# Patient Record
Sex: Female | Born: 1995 | Hispanic: No | Marital: Single | Smoking: Former smoker
Health system: Southern US, Community
[De-identification: ages and names within clinical notes are randomized; demographics above are authoritative.]

## PROBLEM LIST (undated history)

## (undated) ENCOUNTER — Ambulatory Visit (HOSPITAL_BASED_OUTPATIENT_CLINIC_OR_DEPARTMENT_OTHER): Source: Home / Self Care

## (undated) DIAGNOSIS — F419 Anxiety disorder, unspecified: Secondary | ICD-10-CM

## (undated) DIAGNOSIS — S060XAA Concussion with loss of consciousness status unknown, initial encounter: Secondary | ICD-10-CM

## (undated) DIAGNOSIS — S060X9A Concussion with loss of consciousness of unspecified duration, initial encounter: Secondary | ICD-10-CM

## (undated) DIAGNOSIS — Z8489 Family history of other specified conditions: Secondary | ICD-10-CM

## (undated) DIAGNOSIS — Z8759 Personal history of other complications of pregnancy, childbirth and the puerperium: Secondary | ICD-10-CM

## (undated) DIAGNOSIS — O139 Gestational [pregnancy-induced] hypertension without significant proteinuria, unspecified trimester: Secondary | ICD-10-CM

## (undated) HISTORY — DX: Family history of other specified conditions: Z84.89

## (undated) HISTORY — DX: Gestational (pregnancy-induced) hypertension without significant proteinuria, unspecified trimester: O13.9

## (undated) HISTORY — PX: NO PAST SURGERIES: SHX2092

---
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
## 2014-10-06 ENCOUNTER — Emergency Department (HOSPITAL_COMMUNITY)
Admission: EM | Admit: 2014-10-06 | Discharge: 2014-10-06 | Disposition: A | Discharge status: Home / Self Care | Payer: BLUE CROSS/BLUE SHIELD | Attending: Emergency Medicine | Admitting: Emergency Medicine

## 2014-10-06 ENCOUNTER — Ambulatory Visit (INDEPENDENT_AMBULATORY_CARE_PROVIDER_SITE_OTHER): Payer: BLUE CROSS/BLUE SHIELD | Admitting: Emergency Medicine

## 2014-10-06 ENCOUNTER — Encounter (HOSPITAL_COMMUNITY): Payer: Self-pay | Admitting: Emergency Medicine

## 2014-10-06 VITALS — BP 100/60 | HR 81 | Temp 98.0°F | Resp 20 | Ht 61.25 in | Wt 112.1 lb

## 2014-10-06 DIAGNOSIS — S060X1A Concussion with loss of consciousness of 30 minutes or less, initial encounter: Secondary | ICD-10-CM

## 2014-10-06 DIAGNOSIS — Y9389 Activity, other specified: Secondary | ICD-10-CM | POA: Diagnosis not present

## 2014-10-06 DIAGNOSIS — S0001XA Abrasion of scalp, initial encounter: Secondary | ICD-10-CM | POA: Diagnosis not present

## 2014-10-06 DIAGNOSIS — Y998 Other external cause status: Secondary | ICD-10-CM | POA: Insufficient documentation

## 2014-10-06 DIAGNOSIS — R55 Syncope and collapse: Secondary | ICD-10-CM | POA: Diagnosis not present

## 2014-10-06 DIAGNOSIS — Z3202 Encounter for pregnancy test, result negative: Secondary | ICD-10-CM | POA: Insufficient documentation

## 2014-10-06 DIAGNOSIS — W0110XA Fall on same level from slipping, tripping and stumbling with subsequent striking against unspecified object, initial encounter: Secondary | ICD-10-CM | POA: Insufficient documentation

## 2014-10-06 DIAGNOSIS — Y9289 Other specified places as the place of occurrence of the external cause: Secondary | ICD-10-CM | POA: Insufficient documentation

## 2014-10-06 DIAGNOSIS — R5383 Other fatigue: Secondary | ICD-10-CM | POA: Diagnosis not present

## 2014-10-06 LAB — COMPREHENSIVE METABOLIC PANEL
ALK PHOS: 69 U/L (ref 38–126)
ALT: 24 U/L (ref 14–54)
AST: 25 U/L (ref 15–41)
Albumin: 4.4 g/dL (ref 3.5–5.0)
Anion gap: 9 (ref 5–15)
BUN: 12 mg/dL (ref 6–20)
CHLORIDE: 106 mmol/L (ref 101–111)
CO2: 23 mmol/L (ref 22–32)
Calcium: 9.6 mg/dL (ref 8.9–10.3)
Creatinine, Ser: 0.54 mg/dL (ref 0.44–1.00)
GFR calc Af Amer: >60 mL/min (ref >60)
GLUCOSE: 90 mg/dL (ref 70–99)
POTASSIUM: 3.8 mmol/L (ref 3.5–5.1)
SODIUM: 138 mmol/L (ref 135–145)
TOTAL PROTEIN: 7.5 g/dL (ref 6.5–8.1)
Total Bilirubin: 0.6 mg/dL (ref 0.3–1.2)

## 2014-10-06 LAB — CBC WITH DIFFERENTIAL/PLATELET
Basophils Absolute: 0 10*3/uL (ref 0.0–0.1)
Basophils Relative: 0 % (ref 0–1)
Eosinophils Absolute: 0.1 10*3/uL (ref 0.0–0.7)
Eosinophils Relative: 1 % (ref 0–5)
HCT: 39.3 % (ref 36.0–46.0)
Hemoglobin: 12.9 g/dL (ref 12.0–15.0)
LYMPHS PCT: 18 % (ref 12–46)
Lymphs Abs: 1.7 10*3/uL (ref 0.7–4.0)
MCH: 29 pg (ref 26.0–34.0)
MCHC: 32.8 g/dL (ref 30.0–36.0)
MCV: 88.3 fL (ref 78.0–100.0)
MONO ABS: 0.4 10*3/uL (ref 0.1–1.0)
Monocytes Relative: 4 % (ref 3–12)
NEUTROS ABS: 7.4 10*3/uL (ref 1.7–7.7)
Neutrophils Relative %: 77 % (ref 43–77)
PLATELETS: 295 10*3/uL (ref 150–400)
RBC: 4.45 MIL/uL (ref 3.87–5.11)
RDW: 12.4 % (ref 11.5–15.5)
WBC: 9.6 10*3/uL (ref 4.0–10.5)

## 2014-10-06 LAB — POC URINE PREG, ED: PREG TEST UR: NEGATIVE

## 2014-10-06 LAB — URINE MICROSCOPIC-ADD ON

## 2014-10-06 LAB — URINALYSIS, ROUTINE W REFLEX MICROSCOPIC
Bilirubin Urine: NEGATIVE
GLUCOSE, UA: NEGATIVE mg/dL
Hgb urine dipstick: NEGATIVE
KETONES UR: NEGATIVE mg/dL
NITRITE: NEGATIVE
PH: 6.5 (ref 5.0–8.0)
PROTEIN: NEGATIVE mg/dL
SPECIFIC GRAVITY, URINE: 1.02 (ref 1.005–1.030)
UROBILINOGEN UA: 1 mg/dL (ref 0.0–1.0)

## 2014-10-06 MED ORDER — ACETAMINOPHEN 500 MG PO TABS
1000.0000 mg | ORAL_TABLET | Freq: Once | ORAL | Status: AC
  Administered 2014-10-06: 1000 mg via ORAL
  Filled 2014-10-06: qty 2

## 2014-10-06 NOTE — ED Provider Notes (Signed)
CSN: [id number]     Arrival date & time 10/06/14  1910 History  This chart was scribed for non-physician practitioner [name], PA-C working with [name], MD by [name], ED Scribe. This patient was seen in room TR09C/TR09C and the patient's care was started at 11:03 PM.  Chief Complaint  Patient presents with  . Loss of Consciousness     Patient is a 19 y.o. female presenting with syncope. The history is provided by the patient and a parent. No language interpreter was used.  Loss of Consciousness Associated symptoms: headaches   Associated symptoms: no chest pain, no confusion, no fever, no nausea, no shortness of breath, no vomiting and no weakness      HPI Comments: Alexis Hunt is a 19 y.o. female who presents to the Emergency Department complaining of LOC that occurred once earlier today around 8 hours PTA. Pt states that she was at her house earlier, and got up quickly to walk around when she became dizzy. Pt states she then sat back down to rest for a few minutes, got up again, and then had a syncopal episode. Pt states that she fell and hit the left side of her head on the concrete when this occurred. Pt states she was told that she was unconscious for a few minutes. She remembers events before and after the fall. Pt also reports having blurred vision when she woke up but this has resolved.. Her mother reports arriving one hour later and states she was drowsy, but responsive and appropriate. 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 These are usually caused by pain, nausea, seeing blood, etc. Patient actually had a syncopal episode while in the emergency department while her blood was being drawn and quickly returned to baseline. She vomited one time after this. Otherwise, patient has not vomited. Patient denies signs of stroke including: facial droop, slurred  speech, aphasia, weakness/numbness in extremities, imbalance/trouble walking. No bleeding.   Patient was seen at an outside urgent care prior to this ED visit and was referred to the emergency department.    History reviewed. No pertinent past medical history. History reviewed. No pertinent past surgical history. Family History  Problem Relation Age of Onset  . Heart disease Maternal Grandfather   . Hyperlipidemia Maternal Grandfather   . Stroke Maternal Grandfather   . Hyperlipidemia Paternal Grandmother   . Hyperlipidemia Paternal Grandfather    History  Substance Use Topics  . Smoking status: Never Smoker   . Smokeless tobacco: Never Used  . Alcohol Use: No   OB History    No data available     Review of Systems  Constitutional: Positive for fatigue. Negative for fever.  HENT: Negative for tinnitus.   Eyes: Positive for visual disturbance. Negative for photophobia and pain.  Respiratory: Negative for cough and shortness of breath.   Cardiovascular: Positive for syncope. Negative for chest pain and leg swelling.  Gastrointestinal: Negative for nausea and vomiting.  Musculoskeletal: Negative for back pain, gait problem and neck pain.  Skin: Negative for wound.  Neurological: Positive for syncope, light-headedness and headaches. Negative for speech difficulty, weakness and numbness.  Psychiatric/Behavioral: Negative for confusion and decreased concentration.      Allergies  Review of patient's allergies indicates no known allergies.  Home Medications   Prior to Admission medications   Medication Sig Start Date End Date Taking?  Authorizing Provider  norethindrone-ethinyl estradiol (MICROGESTIN,JUNEL,LOESTRIN) 1-20 MG-MCG tablet Take by mouth. 06/24/14   Historical Provider, MD   BP 121/77 mmHg  Pulse 71  Temp(Src) 98.5 F (36.9 C) (Oral)  Resp 16  SpO2 99%  LMP 09/26/2014   Physical Exam  Constitutional: She is oriented to person, place, and time. She appears  well-developed and well-nourished.  HENT:  Head: Normocephalic and atraumatic. Head is without raccoon's eyes and without Battle's sign.  Right Ear: Tympanic membrane, external ear and ear canal normal. No hemotympanum.  Left Ear: Tympanic membrane, external ear and ear canal normal. No hemotympanum.  Nose: Nose normal. No nasal septal hematoma.  Mouth/Throat: Uvula is midline, oropharynx is clear and moist and mucous membranes are normal.  Mild abrasion left parietal scalp. Mild overlying swelling. No depressions.  Eyes: Conjunctivae, EOM and lids are normal. Pupils are equal, round, and reactive to light. Right eye exhibits no nystagmus. Left eye exhibits no nystagmus.  No visible hyphema noted  Neck: Normal range of motion. Neck supple.  Cardiovascular: Normal rate and regular rhythm.   No murmur heard. Pulmonary/Chest: Effort normal and breath sounds normal. No respiratory distress. She has no wheezes. She has no rales.  Abdominal: Soft. There is no tenderness.  Musculoskeletal: She exhibits no edema or tenderness.       Cervical back: She exhibits normal range of motion, no tenderness and no bony tenderness.       Thoracic back: She exhibits no tenderness and no bony tenderness.       Lumbar back: She exhibits no tenderness and no bony tenderness.  Neurological: She is alert and oriented to person, place, and time. She has normal strength and normal reflexes. No cranial nerve deficit or sensory deficit. Coordination normal. GCS eye subscore is 4. GCS verbal subscore is 5. GCS motor subscore is 6.  Skin: Skin is warm and dry.  Psychiatric: She has a normal mood and affect.  Nursing note and vitals reviewed.   ED Course  Procedures (including critical care time)  DIAGNOSTIC STUDIES: Oxygen Saturation is 99% on room air, normal by my interpretation.    COORDINATION OF CARE: 11:13 PM Discussed treatment plan with pt at bedside and pt agreed to plan.   Labs Review Labs Reviewed   URINALYSIS, ROUTINE W REFLEX MICROSCOPIC - Abnormal; Notable for the following:    APPearance TURBID (*)    Leukocytes, UA SMALL (*)    All other components within normal limits  URINE MICROSCOPIC-ADD ON - Abnormal; Notable for the following:    Squamous Epithelial / LPF FEW (*)    Bacteria, UA MANY (*)    Crystals CA OXALATE CRYSTALS (*)    All other components within normal limits  CBC WITH DIFFERENTIAL/PLATELET  COMPREHENSIVE METABOLIC PANEL  POC URINE PREG, ED    Imaging Review No results found.   EKG Interpretation   Date/Time:  Monday Oct 06 2014 22:55:20 EDT Ventricular Rate:  61 PR Interval:  144 QRS Duration: 76 QT Interval:  410 QTC Calculation: 412 R Axis:   83 Text Interpretation:  Normal sinus rhythm with sinus arrhythmia  Nonspecific ST abnormality Abnormal ECG Confirmed by Bebe Shaggy  MD, DONALD  [phone]) on 10/06/2014 11:32:13 PM      Vital signs reviewed and are as follows: Filed Vitals:   10/06/14 2347  BP: 101/50  Pulse: 58  Temp: 98.1 F (36.7 C)  Resp: 18   Patient is at her baseline. She is now greater than 8 hours after  the incident and is feeling well other than having a headache. Patient and family are comfortable with discharge to home. We reviewed all results including EKG, blood and urine tests. We discussed signs and symptoms of concussion and when to follow-up with her PCP. Patient was counseled on head injury precautions and symptoms that should indicate their return to the ED. These include severe worsening headache, vision changes, confusion, loss of consciousness, trouble walking, nausea & vomiting, or weakness/tingling in extremities.       MDM   Final diagnoses:  Syncope, unspecified syncope type  Concussion, with loss of consciousness of 30 minutes or less, initial encounter   Patient with syncope with prodrome. It was similar to previous episodes of syncope. Unclear exacerbating factor today. Main concern at this time was head  injury. Patient is been monitored for several hours in emergency department. She has no indication for head CT based on [place] head CT rules. Patient has no neurological deficits at all on exam. She is at her baseline. She has family who will watch her tonight. Patient does have some fatigue which may be a sign of concussion. Otherwise she appears well, is ambulating normally. No vomiting.  No concern for PE causing syncopal episode today. Very low suspicion for cardiac etiology. No signs of WPW, QTc prolongation, or Brugada syndrome on EKG. No family history of early sudden cardiac death. Symptoms did not occur with exercise.  I personally performed the services described in this documentation, which was scribed in my presence. The recorded information has been reviewed and is accurate.    Renne CriglerJoshua Omarri Eich, PA-C 10/07/14 0013  Zadie Rhineonald Wickline, MD 10/08/14 [phone]

## 2014-10-06 NOTE — Progress Notes (Signed)
[facility] [address], [place] [id number] [phone]  Date:  10/06/2014   Name:  Alexis LipsBrianna Hunt   DOB:  1996-04-05   MRN:  [id number]  PCP:  No PCP Per Patient    Chief Complaint: Loss of Consciousness   History of Present Illness:  Alexis Hunt is a 19 y.o. very pleasant female patient who presents with the following:  Patient stood up and felt dizzy and tried to go into the house and passed out She fell and struck her left parietal area Was unconscious for 3 minutes. Spontaneously recovered Had no seizure. Now has severe left pounding headache No neuro  Symptoms.  Has blurred vision. Has history of recurrent syncopal episodes in past undiagnosed No improvement with over the counter medications or other home remedies.  Denies other complaint or health concern today.   There are no active problems to display for this patient.   History reviewed. No pertinent past medical history.  History reviewed. No pertinent past surgical history.  History  Substance Use Topics  . Smoking status: Never Smoker   . Smokeless tobacco: Never Used  . Alcohol Use: No    Family History  Problem Relation Age of Onset  . Heart disease Maternal Grandfather   . Hyperlipidemia Maternal Grandfather   . Stroke Maternal Grandfather   . Hyperlipidemia Paternal Grandmother   . Hyperlipidemia Paternal Grandfather     No Known Allergies  Medication list has been reviewed and updated.  No current outpatient prescriptions on file prior to visit.   No current facility-administered medications on file prior to visit.    Review of Systems:  Review of Systems  Constitutional: Negative for fever, chills and fatigue.  HENT: Negative for congestion, ear pain, hearing loss, postnasal drip, rhinorrhea and sinus pressure.   Eyes: Negative for discharge and redness.  Respiratory: Negative for cough, shortness of breath and wheezing.   Cardiovascular: Negative for  chest pain and leg swelling.  Gastrointestinal: Negative for nausea, vomiting, abdominal pain, constipation and blood in stool.  Genitourinary: Negative for dysuria, urgency and frequency.  Musculoskeletal: Negative for neck stiffness.  Skin: Negative for rash.  Neurological: Negative for seizures, weakness and headaches.     Physical Examination: Filed Vitals:   10/06/14 1819  BP: 100/60  Pulse: 81  Temp: 98 F (36.7 C)  Resp: 20   Filed Vitals:   10/06/14 1819  Height: 5' 1.25" (1.556 m)  Weight: 112 lb 2 oz (50.86 kg)   Body mass index is 21.01 kg/(m^2). Ideal Body Weight: Weight in (lb) to have BMI = 25: 133.1  GEN: WDWN, NAD, Non-toxic, A & O x 3 HEENT: cephalhematoma left, Normocephalic. Neck supple. No masses, No LAD. Ears and Nose: No external deformity. CV: RRR, No M/G/R. No JVD. No thrill. No extra heart sounds. PULM: CTA B, no wheezes, crackles, rhonchi. No retractions. No resp. distress. No accessory muscle use. ABD: S, NT, ND, +BS. No rebound. No HSM. EXTR: No c/c/e NEURO Normal gait. PRRERLA EOMI CN 2-12 intact.  Romberg and tandem gait intact PSYCH: Normally interactive. Conversant. Not depressed or anxious appearing.  Calm demeanor.    Assessment and Plan: Syncope unknown etiology [facility] To ER for evaluation   Signed Phillips OdorJeffery Anderson, MD

## 2014-10-06 NOTE — Patient Instructions (Signed)
Concussion  A concussion, or closed-head injury, is a brain injury caused by a direct blow to the head or by a quick and sudden movement (jolt) of the head or neck. Concussions are usually not life-threatening. Even so, the effects of a concussion can be serious. If you have had a concussion before, you are more likely to experience concussion-like symptoms after a direct blow to the head.   CAUSES  · Direct blow to the head, such as from running into another player during a soccer game, being hit in a fight, or hitting your head on a hard surface.  · A jolt of the head or neck that causes the brain to move back and forth inside the skull, such as in a car crash.  SIGNS AND SYMPTOMS  The signs of a concussion can be hard to notice. Early on, they may be missed by you, family members, and health care providers. You may look fine but act or feel differently.  Symptoms are usually temporary, but they may last for days, weeks, or even longer. Some symptoms may appear right away while others may not show up for hours or days. Every head injury is different. Symptoms include:  · Mild to moderate headaches that will not go away.  · A feeling of pressure inside your head.  · Having more trouble than usual:  ¨ Learning or remembering things you have heard.  ¨ Answering questions.  ¨ Paying attention or concentrating.  ¨ Organizing daily tasks.  ¨ Making decisions and solving problems.  · Slowness in thinking, acting or reacting, speaking, or reading.  · Getting lost or being easily confused.  · Feeling tired all the time or lacking energy (fatigued).  · Feeling drowsy.  · Sleep disturbances.  ¨ Sleeping more than usual.  ¨ Sleeping less than usual.  ¨ Trouble falling asleep.  ¨ Trouble sleeping (insomnia).  · Loss of balance or feeling lightheaded or dizzy.  · Nausea or vomiting.  · Numbness or tingling.  · Increased sensitivity to:  ¨ Sounds.  ¨ Lights.  ¨ Distractions.  · Vision problems or eyes that tire  easily.  · Diminished sense of taste or smell.  · Ringing in the ears.  · Mood changes such as feeling sad or anxious.  · Becoming easily irritated or angry for little or no reason.  · Lack of motivation.  · Seeing or hearing things other people do not see or hear (hallucinations).  DIAGNOSIS  Your health care provider can usually diagnose a concussion based on a description of your injury and symptoms. He or she will ask whether you passed out (lost consciousness) and whether you are having trouble remembering events that happened right before and during your injury.  Your evaluation might include:  · A brain scan to look for signs of injury to the brain. Even if the test shows no injury, you may still have a concussion.  · Blood tests to be sure other problems are not present.  TREATMENT  · Concussions are usually treated in an emergency department, in urgent care, or at a clinic. You may need to stay in the hospital overnight for further treatment.  · Tell your health care provider if you are taking any medicines, including prescription medicines, over-the-counter medicines, and natural remedies. Some medicines, such as blood thinners (anticoagulants) and aspirin, may increase the chance of complications. Also tell your health care provider whether you have had alcohol or are taking illegal drugs. This information   may affect treatment.  · Your health care provider will send you home with important instructions to follow.  · How fast you will recover from a concussion depends on many factors. These factors include how severe your concussion is, what part of your brain was injured, your age, and how healthy you were before the concussion.  · Most people with mild injuries recover fully. Recovery can take time. In general, recovery is slower in older persons. Also, persons who have had a concussion in the past or have other medical problems may find that it takes longer to recover from their current injury.  HOME  CARE INSTRUCTIONS  General Instructions  · Carefully follow the directions your health care provider gave you.  · Only take over-the-counter or prescription medicines for pain, discomfort, or fever as directed by your health care provider.  · Take only those medicines that your health care provider has approved.  · Do not drink alcohol until your health care provider says you are well enough to do so. Alcohol and certain other drugs may slow your recovery and can put you at risk of further injury.  · If it is harder than usual to remember things, write them down.  · If you are easily distracted, try to do one thing at a time. For example, do not try to watch TV while fixing dinner.  · Talk with family members or close friends when making important decisions.  · Keep all follow-up appointments. Repeated evaluation of your symptoms is recommended for your recovery.  · Watch your symptoms and tell others to do the same. Complications sometimes occur after a concussion. Older adults with a brain injury may have a higher risk of serious complications, such as a blood clot on the brain.  · Tell your teachers, school nurse, school counselor, coach, athletic trainer, or work manager about your injury, symptoms, and restrictions. Tell them about what you can or cannot do. They should watch for:  ¨ Increased problems with attention or concentration.  ¨ Increased difficulty remembering or learning new information.  ¨ Increased time needed to complete tasks or assignments.  ¨ Increased irritability or decreased ability to cope with stress.  ¨ Increased symptoms.  · Rest. Rest helps the brain to heal. Make sure you:  ¨ Get plenty of sleep at night. Avoid staying up late at night.  ¨ Keep the same bedtime hours on weekends and weekdays.  ¨ Rest during the day. Take daytime naps or rest breaks when you feel tired.  · Limit activities that require a lot of thought or concentration. These include:  ¨ Doing homework or job-related  work.  ¨ Watching TV.  ¨ Working on the computer.  · Avoid any situation where there is potential for another head injury (football, hockey, soccer, basketball, martial arts, downhill snow sports and horseback riding). Your condition will get worse every time you experience a concussion. You should avoid these activities until you are evaluated by the appropriate follow-up health care providers.  Returning To Your Regular Activities  You will need to return to your normal activities slowly, not all at once. You must give your body and brain enough time for recovery.  · Do not return to sports or other athletic activities until your health care provider tells you it is safe to do so.  · Ask your health care provider when you can drive, ride a bicycle, or operate heavy machinery. Your ability to react may be slower after a   brain injury. Never do these activities if you are dizzy.  · Ask your health care provider about when you can return to work or school.  Preventing Another Concussion  It is very important to avoid another brain injury, especially before you have recovered. In rare cases, another injury can lead to permanent brain damage, brain swelling, or death. The risk of this is greatest during the first 7-10 days after a head injury. Avoid injuries by:  · Wearing a seat belt when riding in a car.  · Drinking alcohol only in moderation.  · Wearing a helmet when biking, skiing, skateboarding, skating, or doing similar activities.  · Avoiding activities that could lead to a second concussion, such as contact or recreational sports, until your health care provider says it is okay.  · Taking safety measures in your home.  ¨ Remove clutter and tripping hazards from floors and stairways.  ¨ Use grab bars in bathrooms and handrails by stairs.  ¨ Place non-slip mats on floors and in bathtubs.  ¨ Improve lighting in dim areas.  SEEK MEDICAL CARE IF:  · You have increased problems paying attention or  concentrating.  · You have increased difficulty remembering or learning new information.  · You need more time to complete tasks or assignments than before.  · You have increased irritability or decreased ability to cope with stress.  · You have more symptoms than before.  Seek medical care if you have any of the following symptoms for more than 2 weeks after your injury:  · Lasting (chronic) headaches.  · Dizziness or balance problems.  · Nausea.  · Vision problems.  · Increased sensitivity to noise or light.  · Depression or mood swings.  · Anxiety or irritability.  · Memory problems.  · Difficulty concentrating or paying attention.  · Sleep problems.  · Feeling tired all the time.  SEEK IMMEDIATE MEDICAL CARE IF:  · You have severe or worsening headaches. These may be a sign of a blood clot in the brain.  · You have weakness (even if only in one hand, leg, or part of the face).  · You have numbness.  · You have decreased coordination.  · You vomit repeatedly.  · You have increased sleepiness.  · One pupil is larger than the other.  · You have convulsions.  · You have slurred speech.  · You have increased confusion. This may be a sign of a blood clot in the brain.  · You have increased restlessness, agitation, or irritability.  · You are unable to recognize people or places.  · You have neck pain.  · It is difficult to wake you up.  · You have unusual behavior changes.  · You lose consciousness.  MAKE SURE YOU:  · Understand these instructions.  · Will watch your condition.  · Will get help right away if you are not doing well or get worse.  Document Released: 08/13/2003 Document Revised: 05/28/2013 Document Reviewed: 12/13/2012  Exit[employer]® Patient Information ©2015 [employer]. This information is not intended to replace advice given to you by your health care provider. Make sure you discuss any questions you have with your health care provider.

## 2014-10-06 NOTE — ED Notes (Signed)
Pt. reports syncopal episode " passed out "  this afternoon , hit her head against pavement , reports pain at left side of head . Alert and oriented , speech clear / no facial asymmetry , equal grips with no arm drift, ambulatory.

## 2014-10-06 NOTE — ED Notes (Signed)
Josh, pa-c, at the bedside.  

## 2014-10-06 NOTE — Discharge Instructions (Signed)
Please read and follow all provided instructions.  Your diagnoses today include:  1. Syncope, unspecified syncope type   2. Concussion, with loss of consciousness of 30 minutes or less, initial encounter     Tests performed today include:  Blood counts and electrolytes - normal  Urine test - no infection or dehydration, calcium oxalate crystals noted  Pregnancy test - negative  EKG - normal  Vital signs. See below for your results today.   Medications prescribed:   None  Take any prescribed medications only as directed.  Home care instructions:  Follow any educational materials contained in this packet.  Do not take any medications containing aspirin for one week as this can interfere with your body's ability to clot.   Follow-up instructions: Please follow-up with your primary care provider in the next 3 days for further evaluation of your symptoms.   Return instructions:  SEEK IMMEDIATE MEDICAL ATTENTION IF:  There is confusion or drowsiness (although children frequently become drowsy after injury).   You cannot awaken the injured person.   You have more than one episode of vomiting.   You notice dizziness or unsteadiness which is getting worse, or inability to walk.   You have convulsions or unconsciousness.   You experience severe, persistent headaches not relieved by Tylenol.  You cannot use arms or legs normally.   There are changes in pupil sizes. (This is the black center in the colored part of the eye)   There is clear or bloody discharge from the nose or ears.   You have change in speech, vision, swallowing, or understanding.   Localized weakness, numbness, tingling, or change in bowel or bladder control.  You have any other emergent concerns.  Additional Information: You have had a head injury which does not appear to require admission at this time.  Your vital signs today were: BP 121/77 mmHg   Pulse 71   Temp(Src) 98.5 F (36.9 C) (Oral)    Resp 16   SpO2 99%   LMP 09/26/2014 If your blood pressure (BP) was elevated above 135/85 this visit, please have this repeated by your doctor within one month. --------------

## 2014-10-09 ENCOUNTER — Emergency Department (HOSPITAL_COMMUNITY)
Admission: EM | Admit: 2014-10-09 | Discharge: 2014-10-09 | Disposition: A | Discharge status: Home / Self Care | Payer: BLUE CROSS/BLUE SHIELD | Attending: Emergency Medicine | Admitting: Emergency Medicine

## 2014-10-09 ENCOUNTER — Encounter (HOSPITAL_COMMUNITY): Payer: Self-pay | Admitting: Emergency Medicine

## 2014-10-09 DIAGNOSIS — G44309 Post-traumatic headache, unspecified, not intractable: Secondary | ICD-10-CM | POA: Insufficient documentation

## 2014-10-09 DIAGNOSIS — F0781 Postconcussional syndrome: Secondary | ICD-10-CM | POA: Insufficient documentation

## 2014-10-09 DIAGNOSIS — R079 Chest pain, unspecified: Secondary | ICD-10-CM | POA: Insufficient documentation

## 2014-10-09 HISTORY — DX: Concussion with loss of consciousness of unspecified duration, initial encounter: S06.0X9A

## 2014-10-09 HISTORY — DX: Concussion with loss of consciousness status unknown, initial encounter: S06.0XAA

## 2014-10-09 NOTE — ED Notes (Signed)
Pt was seen and treated for a concussion on 5/2 after a fall.  Pt st's she has continued to have headache since then.  Denies nausea or vomiting.  Pt alert and oriented x's 3

## 2014-10-09 NOTE — ED Provider Notes (Signed)
CSN: [id number]     Arrival date & time 10/09/14  1551 History  This chart was scribed for Fayrene HelperBowie Brode Sculley, PA-C, working with Blake DivineJohn Wofford, MD by Elon SpannerGarrett Cook, ED Scribe. This patient was seen in room TR02C/TR02C and the patient's care was started at 4:45 PM.   Chief Complaint  Patient presents with  . Headache   The history is provided by the patient. No language interpreter was used.   HPI Comments: Alexis Hunt is a 19 y.o. female with a history of undiagnosed syncopal episodes who presents to the Emergency Department complaining of a pounding headache onset 4 days ago after the patient had a syncopal episode.  The headache has been constant since this time and is rated currently 7/10.  She was seen in the ED the day of onset and where she had labs performed but no imaging.  She was diagnosed with a concussion and advised of return precautions.  She reports yesterday she had some impaired balance and minor, transient confusion at times during the day.  She has also had some pleuritic CP the past two mornings with inspiration.  She also notes that a primary reason for her coming to the ED today is to receive a work note to provide her employer stating she can return to work.  Patient denies history of CP or DVT/PE.  Patient denies recent surgeries.   Patient denies neck pain, leg swelling, numbness/weakness.     Past Medical History  Diagnosis Date  . Concussion    History reviewed. No pertinent past surgical history. Family History  Problem Relation Age of Onset  . Heart disease Maternal Grandfather   . Hyperlipidemia Maternal Grandfather   . Stroke Maternal Grandfather   . Hyperlipidemia Paternal Grandmother   . Hyperlipidemia Paternal Grandfather    History  Substance Use Topics  . Smoking status: Never Smoker   . Smokeless tobacco: Never Used  . Alcohol Use: No   OB History    No data available     Review of Systems  Cardiovascular: Positive for chest pain. Negative for leg  swelling.  Musculoskeletal: Negative for neck pain.  Neurological: Positive for headaches. Negative for weakness and numbness.  All other systems reviewed and are negative.     Allergies  Review of patient's allergies indicates no known allergies.  Home Medications   Prior to Admission medications   Medication Sig Start Date End Date Taking? Authorizing Provider  norethindrone-ethinyl estradiol (MICROGESTIN,JUNEL,LOESTRIN) 1-20 MG-MCG tablet Take by mouth. 06/24/14   Historical Provider, MD   BP 134/72 mmHg  Pulse 89  Temp(Src) 99.1 F (37.3 C) (Oral)  Resp 20  SpO2 99%  LMP 09/26/2014 Physical Exam  Constitutional: She is oriented to person, place, and time. She appears well-developed and well-nourished. No distress.  HENT:  Head: Normocephalic and atraumatic.  Eyes: Conjunctivae and EOM are normal.  Neck: Neck supple. No tracheal deviation present.  Cardiovascular: Normal rate.  Exam reveals no gallop and no friction rub.   No murmur heard. Pulmonary/Chest: Effort normal. No respiratory distress.  Musculoskeletal: Normal range of motion.  Neurological: She is alert and oriented to person, place, and time.  Neurologic exam:  Speech clear, pupils equal round reactive to light, extraocular movements intact  Normal peripheral visual fields Cranial nerves III through XII normal including no facial droop Follows commands, moves all extremities x4, normal strength to bilateral upper and lower extremities at all major muscle groups including grip Sensation normal to light touch and pinprick Coordination  intact, no limb ataxia, finger-nose-finger normal Rapid alternating movements normal No pronator drift Gait normal   Skin: Skin is warm and dry.  Psychiatric: She has a normal mood and affect. Her behavior is normal.  Nursing note and vitals reviewed.   ED Course  Procedures (including critical care time)  DIAGNOSTIC STUDIES: Oxygen Saturation is 99% on RA, normal by  my interpretation.    COORDINATION OF CARE:  4:56 PM Discussed treatment plan with patient at bedside.  Patient acknowledges and agrees with plan.    5:06 PM Patient presents with lasting headache and symptoms consistence with postconcussive syndrome. She has no focal neuro deficit on exam. She has had a prior EKG shows normal sinus rhythm with sinus arrhythmia. I recommend patient to follow-up with cardiologist for outpatient evaluation. She did report some chest discomfort. However have low suspicion that this is related to PE, ACS or other acute emergent medical condition. Her primary request this to have a note to return to work. Work note provided. Return precautions discussed. I do not think advanced imaging is beneficial at this time and patient agrees.  Labs Review Labs Reviewed - No data to display  Imaging Review No results found.   EKG Interpretation None      MDM   Final diagnoses:  Post concussive syndrome    BP 134/72 mmHg  Pulse 89  Temp(Src) 99.1 F (37.3 C) (Oral)  Resp 20  SpO2 99%  LMP 09/26/2014   I personally performed the services described in this documentation, which was scribed in my presence. The recorded information has been reviewed and is accurate.     Fayrene HelperBowie Zyron Deeley, PA-C 10/09/14 1708  Blake DivineJohn Wofford, MD 10/11/14 [phone]

## 2014-10-09 NOTE — Discharge Instructions (Signed)
Post-Concussion Syndrome Post-concussion syndrome describes the symptoms that can occur after a head injury. These symptoms can last from weeks to months. CAUSES  It is not clear why some head injuries cause post-concussion syndrome. It can occur whether your head injury was mild or severe and whether you were wearing head protection or not.  SIGNS AND SYMPTOMS  Memory difficulties.  Dizziness.  Headaches.  Double vision or blurry vision.  Sensitivity to light.  Hearing difficulties.  Depression.  Tiredness.  Weakness.  Difficulty with concentration.  Difficulty sleeping or staying asleep.  Vomiting.  Poor balance or instability on your feet.  Slow reaction time.  Difficulty learning and remembering things you have heard. DIAGNOSIS  There is no test to determine whether you have post-concussion syndrome. Your health care provider may order an imaging scan of your brain, such as a CT scan, to check for other problems that may be causing your symptoms (such as severe injury inside your skull). TREATMENT  Usually, these problems disappear over time without medical care. Your health care provider may prescribe medicine to help ease your symptoms. It is important to follow up with a neurologist to evaluate your recovery and address any lingering symptoms or issues. HOME CARE INSTRUCTIONS   Only take over-the-counter or prescription medicines for pain, discomfort, or fever as directed by your health care provider. Do not take aspirin. Aspirin can slow blood clotting.  Sleep with your head slightly elevated to help with headaches.  Avoid any situation where there is potential for another head injury (football, hockey, soccer, basketball, martial arts, downhill snow sports, and horseback riding). Your condition will get worse every time you experience a concussion. You should avoid these activities until you are evaluated by the appropriate follow-up health care  providers.  Keep all follow-up appointments as directed by your health care provider. SEEK IMMEDIATE MEDICAL CARE IF:  You develop confusion or unusual drowsiness.  You cannot wake the injured person.  You develop nausea or persistent, forceful vomiting.  You feel like you are moving when you are not (vertigo).  You notice the injured person's eyes moving rapidly back and forth. This may be a sign of vertigo.  You have convulsions or faint.  You have severe, persistent headaches that are not relieved by medicine.  You cannot use your arms or legs normally.  Your pupils change size.  You have clear or bloody discharge from the nose or ears.  Your problems are getting worse, not better. MAKE SURE YOU:  Understand these instructions.  Will watch your condition.  Will get help right away if you are not doing well or get worse. Document Released: 11/12/2001 Document Revised: 03/13/2013 Document Reviewed: 08/28/2013 [employer] Patient Information 2015 [employer]. This information is not intended to replace advice given to you by your health care provider. Make sure you discuss any questions you have with your health care provider.  

## 2014-10-09 NOTE — ED Notes (Signed)
Pt A&Ox4, ambulatory at d/c with steady gait, NAD 

## 2015-02-01 ENCOUNTER — Encounter (HOSPITAL_COMMUNITY): Payer: Self-pay | Admitting: Emergency Medicine

## 2015-02-01 ENCOUNTER — Emergency Department (HOSPITAL_COMMUNITY)
Admission: EM | Admit: 2015-02-01 | Discharge: 2015-02-01 | Discharge status: Against Medical Advice | Payer: BLUE CROSS/BLUE SHIELD | Attending: Emergency Medicine | Admitting: Emergency Medicine

## 2015-02-01 ENCOUNTER — Emergency Department (HOSPITAL_COMMUNITY): Payer: BLUE CROSS/BLUE SHIELD

## 2015-02-01 DIAGNOSIS — Z79818 Long term (current) use of other agents affecting estrogen receptors and estrogen levels: Secondary | ICD-10-CM | POA: Diagnosis not present

## 2015-02-01 DIAGNOSIS — R109 Unspecified abdominal pain: Secondary | ICD-10-CM | POA: Diagnosis present

## 2015-02-01 DIAGNOSIS — R079 Chest pain, unspecified: Secondary | ICD-10-CM | POA: Diagnosis not present

## 2015-02-01 DIAGNOSIS — Z87828 Personal history of other (healed) physical injury and trauma: Secondary | ICD-10-CM | POA: Diagnosis not present

## 2015-02-01 NOTE — Discharge Instructions (Signed)
I am concerned that you may have a pulmonary embolus which is a blood clot in your lung causing your pain.  It is recommended that you have blood work done to evaluate this further.  You have refused blood work and further workup.  If you change your mind, you're welcome to come back at any time.  I recommend that you stop taking birth control pills.  It is also recommended that you quit smoking.  Tylenol or Ibuprofen for pain.   Pain of Unknown Etiology (Pain Without a Known Cause) You have come to your caregiver because of pain. Pain can occur in any part of the body. Often there is not a definite cause. If your laboratory (blood or urine) work was normal and X-rays or other studies were normal, your caregiver may treat you without knowing the cause of the pain. An example of this is the headache. Most headaches are diagnosed by taking a history. This means your caregiver asks you questions about your headaches. Your caregiver determines a treatment based on your answers. Usually testing done for headaches is normal. Often testing is not done unless there is no response to medications. Regardless of where your pain is located today, you can be given medications to make you comfortable. If no physical cause of pain can be found, most cases of pain will gradually leave as suddenly as they came.  If you have a painful condition and no reason can be found for the pain, it is important that you follow up with your caregiver. If the pain becomes worse or does not go away, it may be necessary to repeat tests and look further for a possible cause.  Only take over-the-counter or prescription medicines for pain, discomfort, or fever as directed by your caregiver.  For the protection of your privacy, test results cannot be given over the phone. Make sure you receive the results of your test. Ask how these results are to be obtained if you have not been informed. It is your responsibility to obtain your test  results.  You may continue all activities unless the activities cause more pain. When the pain lessens, it is important to gradually resume normal activities. Resume activities by beginning slowly and gradually increasing the intensity and duration of the activities or exercise. During periods of severe pain, bed rest may be helpful. Lie or sit in any position that is comfortable.  Ice used for acute (sudden) conditions may be effective. Use a large plastic bag filled with ice and wrapped in a towel. This may provide pain relief.  See your caregiver for continued problems. Your caregiver can help or refer you for exercises or physical therapy if necessary. If you were given medications for your condition, do not drive, operate machinery or power tools, or sign legal documents for 24 hours. Do not drink alcohol, take sleeping pills, or take other medications that may interfere with treatment. See your caregiver immediately if you have pain that is becoming worse and not relieved by medications. Document Released: 02/15/2001 Document Revised: 03/13/2013 Document Reviewed: 05/23/2005 [facility] Patient Information 2015 [place], [place]. This information is not intended to replace advice given to you by your health care provider. Make sure you discuss any questions you have with your health care provider.

## 2015-02-01 NOTE — ED Notes (Signed)
Pt arrived to the Ed with a complaint of right sided rib pain.  Pt states pain began late Thursday evening.  Pt states that the pain feels as if someone has punched her.  Pt states yesterday the pain radiated to the right chest.

## 2015-02-01 NOTE — ED Provider Notes (Signed)
CSN: [id number]     Arrival date & time 02/01/15  0138 History  This chart was scribed for Marisa Severin, MD by Doreatha Martin, ED Scribe. This patient was seen in room WA13/WA13 and the patient's care was started at 2:35 AM.     Chief Complaint  Patient presents with  . Flank Pain   The history is provided by the patient. No language interpreter was used.    HPI Comments: Dietrich Ke is a 19 y.o. female who presents to the Emergency Department complaining of moderate, unchanged, sharp right costal pain onset 3 days ago. She notes that pain is worsened with deep breaths or coughs. Pt has taken Ibuprofen with no relief. Pt is a current smoker, <1ppd. She is on oral contraceptives. Pt states left knee pain at baseline that she wears a brace for. Per pt, she has not worn the brace recently. She denies palpitations, fever, cough, leg swelling, new leg pains.   Past Medical History  Diagnosis Date  . Concussion    History reviewed. No pertinent past surgical history. Family History  Problem Relation Age of Onset  . Heart disease Maternal Grandfather   . Hyperlipidemia Maternal Grandfather   . Stroke Maternal Grandfather   . Hyperlipidemia Paternal Grandmother   . Hyperlipidemia Paternal Grandfather    Social History  Substance Use Topics  . Smoking status: Never Smoker   . Smokeless tobacco: Never Used  . Alcohol Use: No   OB History    No data available     Review of Systems  Constitutional: Negative for fever.  Respiratory: Negative for cough.   Cardiovascular: Negative for palpitations and leg swelling.  Genitourinary: Positive for flank pain.  Musculoskeletal: Positive for arthralgias.  All other systems reviewed and are negative.  Allergies  Review of patient's allergies indicates no known allergies.  Home Medications   Prior to Admission medications   Medication Sig Start Date End Date Taking? Authorizing Provider  ibuprofen (ADVIL,MOTRIN) 200 MG tablet Take 400 mg by  mouth every 6 (six) hours as needed for moderate pain.   Yes Historical Provider, MD  norethindrone-ethinyl estradiol (MICROGESTIN,JUNEL,LOESTRIN) 1-20 MG-MCG tablet Take 1 tablet by mouth daily.  06/24/14  Yes Historical Provider, MD   BP 129/75 mmHg  Pulse 105  Temp(Src) 98.8 F (37.1 C) (Oral)  Resp 18  SpO2 100%  LMP 01/09/2015 (Within Days) Physical Exam  Constitutional: She is oriented to person, place, and time. She appears well-developed and well-nourished.  HENT:  Head: Normocephalic and atraumatic.  Nose: Nose normal.  Mouth/Throat: Oropharynx is clear and moist.  Eyes: Conjunctivae and EOM are normal. Pupils are equal, round, and reactive to light.  Neck: Normal range of motion. Neck supple. No JVD present. No tracheal deviation present. No thyromegaly present.  Cardiovascular: Normal rate, regular rhythm, normal heart sounds and intact distal pulses.  Exam reveals no gallop and no friction rub.   No murmur heard. Pulmonary/Chest: Effort normal and breath sounds normal. No stridor. No respiratory distress. She has no wheezes. She has no rales. She exhibits no tenderness.  Abdominal: Soft. Bowel sounds are normal. She exhibits no distension and no mass. There is no tenderness. There is no rebound and no guarding.  Musculoskeletal: Normal range of motion. She exhibits no edema or tenderness.  Lymphadenopathy:    She has no cervical adenopathy.  Neurological: She is alert and oriented to person, place, and time. She displays normal reflexes. She exhibits normal muscle tone. Coordination normal.  Skin: Skin  is warm and dry. No rash noted. No erythema. No pallor.  Psychiatric: She has a normal mood and affect. Her behavior is normal. Judgment and thought content normal.  Nursing note and vitals reviewed.  ED Course  Procedures (including critical care time) DIAGNOSTIC STUDIES: Oxygen Saturation is 100% on RA, normal by my interpretation.    COORDINATION OF CARE: 2:43 AM  Discussed treatment plan with pt at bedside and pt agreed to plan.   Labs Review Labs Reviewed - No data to display  Imaging Review No results found. I have personally reviewed and evaluated these images and lab results as part of my medical decision-making.   EKG Interpretation None      MDM   Final diagnoses:  Right-sided chest pain   19 yo female with right rib pain x 2 days.  Pt reports pleuritic chest pain, no pain with palpation or movement.  Pt is on birth control and smoker, high concern for PE.  Pt adamantly refuses blood work.  She has been counseled on risks on refusal of further evaluation, counseled on risk of death.  Pt prefers to leave ama.   I personally performed the services described in this documentation, which was scribed in my presence. The recorded information has been reviewed and is accurate.    Marisa Severin, MD 02/01/15 [id number]

## 2015-02-01 NOTE — ED Notes (Signed)
Pt complains of right rib pain, no injury noted, she stated that it hurts when she takes a deep breath and it hurts down her right leg

## 2015-06-21 ENCOUNTER — Ambulatory Visit (INDEPENDENT_AMBULATORY_CARE_PROVIDER_SITE_OTHER): Payer: BLUE CROSS/BLUE SHIELD | Admitting: Family Medicine

## 2015-06-21 VITALS — BP 106/60 | HR 106 | Temp 98.6°F | Resp 18 | Ht 68.0 in | Wt 108.6 lb

## 2015-06-21 DIAGNOSIS — J069 Acute upper respiratory infection, unspecified: Secondary | ICD-10-CM | POA: Diagnosis not present

## 2015-06-21 MED ORDER — SALINE SPRAY 0.65 % NA SOLN: 1.0000 | NASAL | Status: DC | PRN

## 2015-06-21 MED ORDER — PSEUDOEPHEDRINE HCL ER 120 MG PO TB12: 120.0000 mg | ORAL_TABLET | Freq: Two times a day (BID) | ORAL | Status: DC

## 2015-06-21 MED ORDER — PROMETHAZINE-DM 6.25-15 MG/5ML PO SYRP: 5.0000 mL | ORAL_SOLUTION | Freq: Four times a day (QID) | ORAL | Status: DC | PRN

## 2015-06-21 NOTE — Patient Instructions (Signed)
I recommend frequent warm salt water gargles, hot tea with honey and lemon, rest, and handwashing.  Hot showers or breathing in steam may help loosen the congestion.  Try a netti pot or sinus rinse is also likely to help you feel better and keep this from progressing.  Use nasal saline spray frequently throughout the day.  Start behind the counter sudafed every morning and the cough syrup (which will also help with nausea) at night.  Upper Respiratory Infection, Adult Most upper respiratory infections (URIs) are a viral infection of the air passages leading to the lungs. A URI affects the nose, throat, and upper air passages. The most common type of URI is nasopharyngitis and is typically referred to as "the common cold." URIs run their course and usually go away on their own. Most of the time, a URI does not require medical attention, but sometimes a bacterial infection in the upper airways can follow a viral infection. This is called a secondary infection. Sinus and middle ear infections are common types of secondary upper respiratory infections. Bacterial pneumonia can also complicate a URI. A URI can worsen asthma and chronic obstructive pulmonary disease (COPD). Sometimes, these complications can require emergency medical care and may be life threatening.  CAUSES Almost all URIs are caused by viruses. A virus is a type of germ and can spread from one person to another.  RISKS FACTORS You may be at risk for a URI if:   You smoke.   You have chronic heart or lung disease.  You have a weakened defense (immune) system.   You are very young or very old.   You have nasal allergies or asthma.  You work in crowded or poorly ventilated areas.  You work in health care facilities or schools. SIGNS AND SYMPTOMS  Symptoms typically develop 2-3 days after you come in contact with a cold virus. Most viral URIs last 7-10 days. However, viral URIs from the influenza virus (flu virus) can last 14-18  days and are typically more severe. Symptoms may include:   Runny or stuffy (congested) nose.   Sneezing.   Cough.   Sore throat.   Headache.   Fatigue.   Fever.   Loss of appetite.   Pain in your forehead, behind your eyes, and over your cheekbones (sinus pain).  Muscle aches.  DIAGNOSIS  Your health care provider may diagnose a URI by:  Physical exam.  Tests to check that your symptoms are not due to another condition such as:  Strep throat.  Sinusitis.  Pneumonia.  Asthma. TREATMENT  A URI goes away on its own with time. It cannot be cured with medicines, but medicines may be prescribed or recommended to relieve symptoms. Medicines may help:  Reduce your fever.  Reduce your cough.  Relieve nasal congestion. HOME CARE INSTRUCTIONS   Take medicines only as directed by your health care provider.   Gargle warm saltwater or take cough drops to comfort your throat as directed by your health care provider.  Use a warm mist humidifier or inhale steam from a shower to increase air moisture. This may make it easier to breathe.  Drink enough fluid to keep your urine clear or pale yellow.   Eat soups and other clear broths and maintain good nutrition.   Rest as needed.   Return to work when your temperature has returned to normal or as your health care provider advises. You may need to stay home longer to avoid infecting others. You can  also use a face mask and careful hand washing to prevent spread of the virus.  Increase the usage of your inhaler if you have asthma.   Do not use any tobacco products, including cigarettes, chewing tobacco, or electronic cigarettes. If you need help quitting, ask your health care provider. PREVENTION  The best way to protect yourself from getting a cold is to practice good hygiene.   Avoid oral or hand contact with people with cold symptoms.   Wash your hands often if contact occurs.  There is no clear  evidence that vitamin C, vitamin E, echinacea, or exercise reduces the chance of developing a cold. However, it is always recommended to get plenty of rest, exercise, and practice good nutrition.  SEEK MEDICAL CARE IF:   You are getting worse rather than better.   Your symptoms are not controlled by medicine.   You have chills.  You have worsening shortness of breath.  You have brown or red mucus.  You have yellow or brown nasal discharge.  You have pain in your face, especially when you bend forward.  You have a fever.  You have swollen neck glands.  You have pain while swallowing.  You have white areas in the back of your throat. SEEK IMMEDIATE MEDICAL CARE IF:   You have severe or persistent:  Headache.  Ear pain.  Sinus pain.  Chest pain.  You have chronic lung disease and any of the following:  Wheezing.  Prolonged cough.  Coughing up blood.  A change in your usual mucus.  You have a stiff neck.  You have changes in your:  Vision.  Hearing.  Thinking.  Mood. MAKE SURE YOU:   Understand these instructions.  Will watch your condition.  Will get help right away if you are not doing well or get worse.   This information is not intended to replace advice given to you by your health care provider. Make sure you discuss any questions you have with your health care provider.   Document Released: 11/16/2000 Document Revised: 10/07/2014 Document Reviewed: 08/28/2013 Elsevier Interactive Patient Education [employer]

## 2015-06-21 NOTE — Progress Notes (Signed)
Subjective:  By signing my name below, I, Raven Small, attest that this documentation has been prepared under the direction and in the presence of Norberto SorensonEva Shaw, MD.  Electronically Signed: Andrew Auaven Small, ED Scribe. 06/21/2015. 10:54 AM.    Patient ID: Alexis Hunt, female    DOB: 11-Mar-1996, 20 y.o.   MRN: [id number]  HPI Chief Complaint  Patient presents with  . Fever    Unspecified x 2 days  . Laryngitis  . Cough    Non productive  . Chest congestion  . Nasal Congestion   HPI Comments: Alexis Hunt is a 20 y.o. female who presents to the [facility] complaining of non productive cough. Pt states symptoms started 2 days ago with a scratchy throat. The following day she developed hoarseness, nasal congestion and cough. While at work yesterday she developed a fever. Today, she woke up with chest congestion. She has been taking OTC medication. She had nausea while eating yesterday but no decreased appetite or emesis. She has had sick contact of her significant other.   Pt works at [employer].   Past Medical History  Diagnosis Date  . Concussion    History reviewed. No pertinent past surgical history. Prior to Admission medications   Medication Sig Start Date End Date Taking? Authorizing Provider  ibuprofen (ADVIL,MOTRIN) 200 MG tablet Take 400 mg by mouth every 6 (six) hours as needed for moderate pain.   Yes Historical Provider, MD  norethindrone-ethinyl estradiol (MICROGESTIN,JUNEL,LOESTRIN) 1-20 MG-MCG tablet Take 1 tablet by mouth daily.  06/24/14  Yes Historical Provider, MD   Review of Systems  Constitutional: Positive for fever and chills. Negative for appetite change.  HENT: Positive for congestion and sore throat. Negative for ear pain.   Respiratory: Positive for cough.   Gastrointestinal: Positive for nausea. Negative for vomiting.   Objective:   Physical Exam  Constitutional: She is oriented to person, place, and time. She appears well-developed  and well-nourished. No distress.  HENT:  Head: Normocephalic and atraumatic.  Right Ear: Tympanic membrane is erythematous. A middle ear effusion is present.  Left Ear: A middle ear effusion is present.  Mouth/Throat: Posterior oropharyngeal erythema present. No oropharyngeal exudate.  Bilateral mid ear effusion, right worse than left.   Eyes: Conjunctivae and EOM are normal.  Neck: Neck supple.  Cardiovascular: Normal rate.   Pulmonary/Chest: Effort normal.  Musculoskeletal: Normal range of motion.  Lymphadenopathy:    She has no cervical adenopathy.  Neurological: She is alert and oriented to person, place, and time.  Skin: Skin is warm and dry.  Psychiatric: She has a normal mood and affect. Her behavior is normal.  Nursing note and vitals reviewed.   Filed Vitals:   06/21/15 1038  BP: 106/60  Pulse: 106  Temp: 98.6 F (37 C)  TempSrc: Oral  Resp: 18  Height: 5\' 8"  (1.727 m)  Weight: 108 lb 9.6 oz (49.261 kg)  SpO2: 98%   Assessment & Plan:   1. Acute upper respiratory infection   viral - supportive care, respiratory hygiene, fluids, rest.   Meds ordered this encounter  Medications  . promethazine-dextromethorphan (PROMETHAZINE-DM) 6.25-15 MG/5ML syrup    Sig: Take 5 mLs by mouth 4 (four) times daily as needed for cough.    Dispense:  118 mL    Refill:  0  . pseudoephedrine (SUDAFED 12 HOUR) 120 MG 12 hr tablet    Sig: Take 1 tablet (120 mg total) by mouth 2 (two) times daily.  Dispense:  30 tablet    Refill:  0  . sodium chloride (OCEAN) 0.65 % SOLN nasal spray    Sig: Place 1 spray into both nostrils as needed for congestion.    Dispense:  15 mL    Refill:  0     I personally performed the services described in this documentation, which was scribed in my presence. The recorded information has been reviewed and considered, and addended by me as needed.  Norberto Sorenson, MD MPH

## 2015-10-29 ENCOUNTER — Ambulatory Visit (INDEPENDENT_AMBULATORY_CARE_PROVIDER_SITE_OTHER): Payer: BLUE CROSS/BLUE SHIELD | Admitting: Family Medicine

## 2015-10-29 VITALS — BP 118/68 | HR 118 | Temp 98.4°F | Resp 18 | Ht 68.0 in | Wt 114.2 lb

## 2015-10-29 DIAGNOSIS — J301 Allergic rhinitis due to pollen: Secondary | ICD-10-CM | POA: Diagnosis not present

## 2015-10-29 DIAGNOSIS — R1013 Epigastric pain: Secondary | ICD-10-CM

## 2015-10-29 DIAGNOSIS — R110 Nausea: Secondary | ICD-10-CM | POA: Diagnosis not present

## 2015-10-29 LAB — POC MICROSCOPIC URINALYSIS (UMFC): Mucus: ABSENT

## 2015-10-29 LAB — POCT URINALYSIS DIP (MANUAL ENTRY)
BILIRUBIN UA: NEGATIVE
BILIRUBIN UA: NEGATIVE
Glucose, UA: NEGATIVE
Nitrite, UA: NEGATIVE
PH UA: 6.5
RBC UA: NEGATIVE
Spec Grav, UA: 1.02
Urobilinogen, UA: 1

## 2015-10-29 LAB — POCT URINE PREGNANCY: PREG TEST UR: NEGATIVE

## 2015-10-29 MED ORDER — ONDANSETRON 8 MG PO TBDP: 8.0000 mg | ORAL_TABLET | Freq: Three times a day (TID) | ORAL | Status: DC | PRN

## 2015-10-29 NOTE — Progress Notes (Signed)
Subjective:    Patient ID: Alexis Hunt, female    DOB: 07-05-1995, 20 y.o.   MRN: [id number]  10/29/2015  Nausea   HPI This 20 y.o. female presents for evaluation of nausea.  Onset this morning at 6:00am. Awoke at 6;00am with nausea; vomited x 2 at 7:20am.  No fever but +chills; no sweats.  No abdominal pain. NO diarrhea.  No dysuria, urgency, hematuria, frequency.  +burning in chest this morning; onset with indigestion and heartburn last night.  Ate teriyaki chicken with tator tots.  Does not feel sick but feels nauseated.  LMP 2 weeks ago.  Works at [employer]; needs note for work.  PCP: [facility]   Review of Systems  Constitutional: Negative for fever, chills, diaphoresis and fatigue.  Eyes: Negative for visual disturbance.  Respiratory: Negative for cough and shortness of breath.   Cardiovascular: Negative for chest pain, palpitations and leg swelling.  Gastrointestinal: Positive for nausea and vomiting. Negative for abdominal pain, diarrhea and constipation.  Endocrine: Negative for cold intolerance, heat intolerance, polydipsia, polyphagia and polyuria.  Genitourinary: Negative for dysuria, frequency, hematuria, flank pain, vaginal bleeding, vaginal discharge, genital sores, vaginal pain and menstrual problem.  Neurological: Negative for dizziness, tremors, seizures, syncope, facial asymmetry, speech difficulty, weakness, light-headedness, numbness and headaches.    Past Medical History  Diagnosis Date  . Concussion    History reviewed. No pertinent past surgical history. No Known Allergies  Social History   Social History  . Marital Status: Single    Spouse Name: N/A  . Number of Children: N/A  . Years of Education: N/A   Occupational History  . Not on file.   Social History Main Topics  . Smoking status: Never Smoker   . Smokeless tobacco: Never Used  . Alcohol Use: No  . Drug Use: No  . Sexual Activity: Yes    Birth Control/ Protection: Pill   Other  Topics Concern  . Not on file   Social History Narrative   Family History  Problem Relation Age of Onset  . Heart disease Maternal Grandfather   . Hyperlipidemia Maternal Grandfather   . Stroke Maternal Grandfather   . Hyperlipidemia Paternal Grandmother   . Hyperlipidemia Paternal Grandfather        Objective:    BP 118/68 mmHg  Pulse 118  Temp(Src) 98.4 F (36.9 C) (Oral)  Resp 18  Ht 5\' 8"  (1.727 m)  Wt 114 lb 3.2 oz (51.801 kg)  BMI 17.37 kg/m2  SpO2 98%  LMP 10/15/2015 Physical Exam  Constitutional: She is oriented to person, place, and time. She appears well-developed and well-nourished. No distress.  HENT:  Head: Normocephalic and atraumatic.  Right Ear: Tympanic membrane, external ear and ear canal normal.  Left Ear: Tympanic membrane, external ear and ear canal normal.  Nose: Nose normal. Right sinus exhibits no maxillary sinus tenderness and no frontal sinus tenderness. Left sinus exhibits no maxillary sinus tenderness and no frontal sinus tenderness.  Mouth/Throat: Uvula is midline, oropharynx is clear and moist and mucous membranes are normal.  Eyes: Conjunctivae are normal. Pupils are equal, round, and reactive to light.  Neck: Normal range of motion. Neck supple.  Cardiovascular: Normal rate, regular rhythm and normal heart sounds.  Exam reveals no gallop and no friction rub.   No murmur heard. Pulmonary/Chest: Effort normal and breath sounds normal. She has no wheezes. She has no rales.  Abdominal: Soft. Bowel sounds are normal. She exhibits no distension and no mass. There is no  tenderness. There is no rebound and no guarding.  Neurological: She is alert and oriented to person, place, and time.  Skin: She is not diaphoretic.  Psychiatric: She has a normal mood and affect. Her behavior is normal.  Nursing note and vitals reviewed.  Results for orders placed or performed in visit on 10/29/15  POCT urinalysis dipstick  Result Value Ref Range   Color, UA  yellow yellow   Clarity, UA cloudy (A) clear   Glucose, UA negative negative   Bilirubin, UA negative negative   Ketones, POC UA negative negative   Spec Grav, UA 1.020    Blood, UA negative negative   pH, UA 6.5    Protein Ur, POC =30 (A) negative   Urobilinogen, UA 1.0    Nitrite, UA Negative Negative   Leukocytes, UA small (1+) (A) Negative  POCT Microscopic Urinalysis ([facility])  Result Value Ref Range   WBC,UR,HPF,POC Too numerous to count  (A) None WBC/hpf   RBC,UR,HPF,POC None None RBC/hpf   Bacteria Many (A) None, Too numerous to count   Mucus Absent Absent   Epithelial Cells, UR Per Microscopy Moderate (A) None, Too numerous to count cells/hpf  POCT urine pregnancy  Result Value Ref Range   Preg Test, Ur Negative Negative       Assessment & Plan:   1. Nausea   2. Dyspepsia   3. Allergic rhinitis due to pollen    -New and uncontrolled. -rx for Zofran provided. -recommend BRAT diet, hydration; RTC for acute worsening. -recommend OTC Zantac  bid and Claritin  daily. -OOW note for today.  Orders Placed This Encounter  Procedures  . Urine culture    Order Specific Question:  Source    Answer:  urine  . POCT urinalysis dipstick  . POCT Microscopic Urinalysis ([facility])  . POCT urine pregnancy   Meds ordered this encounter  Medications  . ondansetron (ZOFRAN-ODT) 8 MG disintegrating tablet    Sig: Take 1 tablet (8 mg total) by mouth every 8 (eight) hours as needed for nausea.    Dispense:  20 tablet    Refill:  0    No Follow-up on file.    Kimesha Claxton Paulita Fujita, M.D. [facility] [address] [place], [place]  [address] [phone] phone [phone] fax

## 2015-10-29 NOTE — Patient Instructions (Addendum)
1.  Zantac/Ranitidine   -- 1 tablet twice daily for heartburn. 2.  Zyrtec or Claritin  --- 1 daily for allergies/sneezing.  Nausea and Vomiting Nausea is a sick feeling that often comes before throwing up (vomiting). Vomiting is a reflex where stomach contents come out of your mouth. Vomiting can cause severe loss of body fluids (dehydration). Children and elderly adults can become dehydrated quickly, especially if they also have diarrhea. Nausea and vomiting are symptoms of a condition or disease. It is important to find the cause of your symptoms. CAUSES   Direct irritation of the stomach lining. This irritation can result from increased acid production (gastroesophageal reflux disease), infection, food poisoning, taking certain medicines (such as nonsteroidal anti-inflammatory drugs), alcohol use, or tobacco use.  Signals from the brain.These signals could be caused by a headache, heat exposure, an inner ear disturbance, increased pressure in the brain from injury, infection, a tumor, or a concussion, pain, emotional stimulus, or metabolic problems.  An obstruction in the gastrointestinal tract (bowel obstruction).  Illnesses such as diabetes, hepatitis, gallbladder problems, appendicitis, kidney problems, cancer, sepsis, atypical symptoms of a heart attack, or eating disorders.  Medical treatments such as chemotherapy and radiation.  Receiving medicine that makes you sleep (general anesthetic) during surgery. DIAGNOSIS Your caregiver may ask for tests to be done if the problems do not improve after a few days. Tests may also be done if symptoms are severe or if the reason for the nausea and vomiting is not clear. Tests may include:  Urine tests.  Blood tests.  Stool tests.  Cultures (to look for evidence of infection).  X-rays or other imaging studies. Test results can help your caregiver make decisions about treatment or the need for additional tests. TREATMENT You need  to stay well hydrated. Drink frequently but in small amounts.You may wish to drink water, sports drinks, clear broth, or eat frozen ice pops or gelatin dessert to help stay hydrated.When you eat, eating slowly may help prevent nausea.There are also some antinausea medicines that may help prevent nausea. HOME CARE INSTRUCTIONS   Take all medicine as directed by your caregiver.  If you do not have an appetite, do not force yourself to eat. However, you must continue to drink fluids.  If you have an appetite, eat a normal diet unless your caregiver tells you differently.  Eat a variety of complex carbohydrates (rice, wheat, potatoes, bread), lean meats, yogurt, fruits, and vegetables.  Avoid high-fat foods because they are more difficult to digest.  Drink enough water and fluids to keep your urine clear or pale yellow.  If you are dehydrated, ask your caregiver for specific rehydration instructions. Signs of dehydration may include:  Severe thirst.  Dry lips and mouth.  Dizziness.  Dark urine.  Decreasing urine frequency and amount.  Confusion.  Rapid breathing or pulse. SEEK IMMEDIATE MEDICAL CARE IF:   You have blood or brown flecks (like coffee grounds) in your vomit.  You have black or bloody stools.  You have a severe headache or stiff neck.  You are confused.  You have severe abdominal pain.  You have chest pain or trouble breathing.  You do not urinate at least once every 8 hours.  You develop cold or clammy skin.  You continue to vomit for longer than 24 to 48 hours.  You have a fever. MAKE SURE YOU:   Understand these instructions.  Will watch your condition.  Will get help right away if you are not doing  well or get worse.   This information is not intended to replace advice given to you by your health care provider. Make sure you discuss any questions you have with your health care provider.   Document Released: 05/23/2005 Document Revised:  08/15/2011 Document Reviewed: 10/20/2010 Elsevier Interactive Patient Education 2016 [employer]     IF you received an x-ray today, you will receive an invoice from [facility]. Please contact [facility] at [phone] with questions or concerns regarding your invoice.   IF you received labwork today, you will receive an invoice from [facility]. Please contact [facility] at [phone] with questions or concerns regarding your invoice.   Our billing staff will not be able to assist you with questions regarding bills from these companies.  You will be contacted with the lab results as soon as they are available. The fastest way to get your results is to activate your My Chart account. Instructions are located on the last page of this paperwork. If you have not heard from us regarding the results in 2 weeks, please contact this office.

## 2015-10-31 LAB — URINE CULTURE: Colony Count: >=100000

## 2015-11-02 MED ORDER — CEPHALEXIN 500 MG PO CAPS: 500.0000 mg | ORAL_CAPSULE | Freq: Three times a day (TID) | ORAL | Status: DC

## 2015-11-02 NOTE — Addendum Note (Signed)
Addended by: Ethelda ChickSMITH, Scotlyn Mccranie M on: 11/02/2015 10:20 PM   Modules accepted: Orders

## 2016-04-11 ENCOUNTER — Ambulatory Visit (INDEPENDENT_AMBULATORY_CARE_PROVIDER_SITE_OTHER): Payer: BLUE CROSS/BLUE SHIELD | Admitting: Physician Assistant

## 2016-04-11 VITALS — BP 114/72 | HR 113 | Temp 98.8°F | Resp 17 | Ht 68.0 in | Wt 109.0 lb

## 2016-04-11 DIAGNOSIS — B852 Pediculosis, unspecified: Secondary | ICD-10-CM

## 2016-04-11 MED ORDER — PERMETHRIN & PYRETH-PIP BUT CO KIT: PACK | 1 refills | Status: DC

## 2016-04-11 NOTE — Progress Notes (Signed)
   04/11/2016 6:10 PM   DOB: 1995-09-15 / MRN: [id number]  SUBJECTIVE:  Alexis Hunt is a 20 y.o. female presenting for a note for work.  She reports that she had lice and took topical over the counter lice treatment.  She had itching before the treatment and reports that this has gotten better.  Her work at [employer] will not let her come back to work without a note.    She has No Known Allergies.   She  has a past medical history of Concussion.    She  reports that she has been smoking.  She has never used smokeless tobacco. She reports that she does not drink alcohol or use drugs. She  reports that she currently engages in sexual activity. She reports using the following method of birth control/protection: Pill. The patient  has no past surgical history on file.  Her family history includes Heart disease in her maternal grandfather; Hyperlipidemia in her maternal grandfather, paternal grandfather, and paternal grandmother; Stroke in her maternal grandfather.  Review of Systems  Skin: Positive for itching and rash.    The problem list and medications were reviewed and updated by myself where necessary and exist elsewhere in the encounter.   OBJECTIVE:  BP 114/72 (BP Location: Right Arm, Patient Position: Sitting, Cuff Size: Normal)   Pulse (!) 113   Temp 98.8 F (37.1 C) (Oral)   Resp 17   Ht '5\' 8"'$  (1.727 m)   Wt 109 lb (49.4 kg)   LMP 04/10/2016   SpO2 99%   BMI 16.57 kg/m   Physical Exam  Constitutional: She is oriented to person, place, and time. She appears well-developed and well-nourished.  Cardiovascular: Normal rate and regular rhythm.   Pulmonary/Chest: Effort normal and breath sounds normal.  Musculoskeletal: Normal range of motion.  Neurological: She is alert and oriented to person, place, and time.  Skin: Skin is warm and dry.  Lice identified on several hair shafts.   Psychiatric: She has a normal mood and affect.    No results found for this or any  previous visit (from the past 72 hour(s)).  No results found.  ASSESSMENT AND PLAN  Alexis Hunt was seen today for head lice.  Diagnoses and all orders for this visit:  Lice infestation -     Permethrin & Pyreth-Pip But KIT; Use as directed on the package. Repeat in 7 days.    The patient is advised to call or return to clinic if she does not see an improvement in symptoms, or to seek the care of the closest emergency department if she worsens with the above plan.   Philis Fendt, MHS, PA-C [facility] 04/11/2016 6:10 PM

## 2016-04-11 NOTE — Patient Instructions (Signed)
     IF you received an x-ray today, you will receive an invoice from [facility]. Please contact [facility] at [phone] with questions or concerns regarding your invoice.   IF you received labwork today, you will receive an invoice from [facility]. Please contact [facility] at [phone] with questions or concerns regarding your invoice.   Our billing staff will not be able to assist you with questions regarding bills from these companies.  You will be contacted with the lab results as soon as they are available. The fastest way to get your results is to activate your My Chart account. Instructions are located on the last page of this paperwork. If you have not heard from us regarding the results in 2 weeks, please contact this office.      

## 2016-05-14 ENCOUNTER — Ambulatory Visit (HOSPITAL_COMMUNITY)
Admission: EM | Admit: 2016-05-14 | Discharge: 2016-05-14 | Discharge status: Absent without leave | Payer: BLUE CROSS/BLUE SHIELD

## 2016-05-16 ENCOUNTER — Ambulatory Visit (HOSPITAL_COMMUNITY)
Admission: EM | Admit: 2016-05-16 | Discharge: 2016-05-16 | Disposition: A | Discharge status: Home / Self Care | Payer: BLUE CROSS/BLUE SHIELD | Attending: Family Medicine | Admitting: Family Medicine

## 2016-05-16 ENCOUNTER — Encounter (HOSPITAL_COMMUNITY): Payer: Self-pay | Admitting: Family Medicine

## 2016-05-16 DIAGNOSIS — B852 Pediculosis, unspecified: Secondary | ICD-10-CM

## 2016-05-16 DIAGNOSIS — Z711 Person with feared health complaint in whom no diagnosis is made: Secondary | ICD-10-CM

## 2016-05-16 MED ORDER — LINDANE 1 % EX SHAM: 1.0000 "application " | MEDICATED_SHAMPOO | Freq: Once | CUTANEOUS | 0 refills | Status: AC

## 2016-05-16 NOTE — ED Triage Notes (Signed)
Patient is concerned for head lice, claims co-workers think she has lice.  Seen by provider prior to this nurse

## 2016-05-16 NOTE — Discharge Instructions (Signed)
We do not see any signs of head lice at this time. I continue a prescription in case symptoms return.

## 2016-05-16 NOTE — ED Provider Notes (Signed)
[place]    CSN: [id number] Arrival date & time: 05/16/16  1102     History   Chief Complaint No chief complaint on file.   HPI Ishana Blades is a 20 y.o. female.   This is a 20 year old woman who comes to the urgent care center seeking evaluation for a note stating that she is no longer contagious from lice. 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 She had been diagnosed with lice and took the Kwell treatment and wash all of her linen. She hasn't had any itching since. She needs a note for work so she will go back.      Past Medical History:  Diagnosis Date  . Concussion     There are no active problems to display for this patient.   History reviewed. No pertinent surgical history.  OB History    No data available       Home Medications    Prior to Admission medications   Medication Sig Start Date End Date Taking? Authorizing Provider  ibuprofen (ADVIL,MOTRIN) 200 MG tablet Take 400 mg by mouth every 6 (six) hours as needed for moderate pain.    Historical Provider, MD  lindane shampoo (KWELL) 1 % Apply 1 application topically once. 05/16/16 05/16/16  Robyn Haber, MD  Permethrin & Pyreth-Pip But KIT Use as directed on the package. Repeat in 7 days. 04/11/16   Tereasa Coop, PA-C    Family History Family History  Problem Relation Age of Onset  . Heart disease Maternal Grandfather   . Hyperlipidemia Maternal Grandfather   . Stroke Maternal Grandfather   . Hyperlipidemia Paternal Grandmother   . Hyperlipidemia Paternal Grandfather     Social History Social History  Substance Use Topics  . Smoking status: Current Every Day Smoker  . Smokeless tobacco: Never Used  . Alcohol use No     Allergies   Patient has no known allergies.   Review of Systems Review of Systems  Constitutional: Negative.   All other systems reviewed and are negative.    Physical Exam Triage Vital Signs ED Triage Vitals [05/16/16 1116]  Enc  Vitals Group     BP 111/81     Pulse Rate 85     Resp 16     Temp 98.5 F (36.9 C)     Temp Source Oral     SpO2 99 %     Weight      Height      Head Circumference      Peak Flow      Pain Score      Pain Loc      Pain Edu?      Excl. in [place]?    No data found.   Updated Vital Signs BP 111/81 (BP Location: Right Arm)   Pulse 85   Temp 98.5 F (36.9 C) (Oral)   Resp 16   SpO2 99%   Visual Acuity Right Eye Distance:   Left Eye Distance:   Bilateral Distance:    Right Eye Near:   Left Eye Near:    Bilateral Near:     Physical Exam  Constitutional: She is oriented to person, place, and time.  HENT:  Head: Normocephalic.  Right Ear: External ear normal.  Left Ear: External ear normal.  Mouth/Throat: Oropharynx is clear and moist.  Eyes: Conjunctivae and EOM are normal.  Neck: Normal range of motion. Neck supple.  Pulmonary/Chest: Effort normal.  Musculoskeletal: Normal range of  motion.  Neurological: She is alert and oriented to person, place, and time.  Skin: Skin is warm and dry.  Nursing note and vitals reviewed.    UC Treatments / Results  Labs (all labs ordered are listed, but only abnormal results are displayed) Labs Reviewed - No data to display  EKG  EKG Interpretation None       Radiology No results found.  Procedures Procedures (including critical care time)  Medications Ordered in UC Medications - No data to display   Initial Impression / Assessment and Plan / UC Course  I have reviewed the triage vital signs and the nursing notes.  Pertinent labs & imaging results that were available during my care of the patient were reviewed by me and considered in my medical decision making (see chart for details).  Clinical Course    I examined the patient's hair and did not see any sign of lice.  Final Clinical Impressions(s) / UC Diagnoses   Final diagnoses:  Worried well    New Prescriptions New Prescriptions   LINDANE SHAMPOO  (KWELL) 1 %    Apply 1 application topically once.     Robyn Haber, MD 05/16/16 [phone]

## 2016-06-05 ENCOUNTER — Emergency Department (HOSPITAL_COMMUNITY)
Admission: EM | Admit: 2016-06-05 | Discharge: 2016-06-05 | Disposition: A | Discharge status: Home / Self Care | Payer: BLUE CROSS/BLUE SHIELD | Attending: Emergency Medicine | Admitting: Emergency Medicine

## 2016-06-05 ENCOUNTER — Encounter (HOSPITAL_COMMUNITY): Payer: Self-pay | Admitting: *Deleted

## 2016-06-05 DIAGNOSIS — F17200 Nicotine dependence, unspecified, uncomplicated: Secondary | ICD-10-CM | POA: Insufficient documentation

## 2016-06-05 DIAGNOSIS — J069 Acute upper respiratory infection, unspecified: Secondary | ICD-10-CM | POA: Diagnosis not present

## 2016-06-05 DIAGNOSIS — R05 Cough: Secondary | ICD-10-CM | POA: Diagnosis present

## 2016-06-05 DIAGNOSIS — Z79899 Other long term (current) drug therapy: Secondary | ICD-10-CM | POA: Diagnosis not present

## 2016-06-05 MED ORDER — IBUPROFEN 800 MG PO TABS: 800.0000 mg | ORAL_TABLET | Freq: Three times a day (TID) | ORAL | 0 refills | Status: DC

## 2016-06-05 MED ORDER — BENZONATATE 100 MG PO CAPS: 100.0000 mg | ORAL_CAPSULE | Freq: Three times a day (TID) | ORAL | 0 refills | Status: DC | PRN

## 2016-06-05 MED ORDER — GUAIFENESIN ER 600 MG PO TB12: 600.0000 mg | ORAL_TABLET | Freq: Two times a day (BID) | ORAL | 0 refills | Status: DC

## 2016-06-05 NOTE — Discharge Instructions (Signed)
Your symptoms are likely viral in nature; therefore, we did not perform a chest xray today. Please start taking Mucinex, Tessalon Perles, and ibuprofen to treat your symptoms. You may also take extra strength Tylenol for pain as well. If your cough worsens or you develop chest pain or shortness of breath you need to return to the emergency department.

## 2016-06-05 NOTE — ED Triage Notes (Addendum)
Pt reports having anxiety. Today having cough, bodyaches, fever/chills and sore throat. Sore throat increases with cough. HR 140 at triage. Reports HR is always elevated at dr office. Unable to draw blood at triage due to pt reports being syncopal everytime she gets blood drawn.

## 2016-06-05 NOTE — ED Notes (Signed)
Declined W/C at D/C and was escorted to lobby by RN. 

## 2016-06-05 NOTE — ED Provider Notes (Signed)
[place] DEPT Provider Note   CSN: [id number] Arrival date & time: 06/05/16  1113   By signing my name below, I, Neta Mends, attest that this documentation has been prepared under the direction and in the presence of Gloriann Loan, PA-C. Electronically Signed: Neta Mends, ED Scribe. 06/05/2016. 12:44 PM.   History   Chief Complaint Chief Complaint  Patient presents with  . Sore Throat  . Fever  . Cough     The history is provided by the patient. No language interpreter was used.   HPI Comments:  Alexis Hunt is a 20 y.o. female who presents to the Emergency Department complaining of a persistent cough since last night. Pt states that the cough has been non-productive. Pt complains of associated sore throat from coughing, congestion. Pt reports sick contacts. Pt is a smoker and smokes 1 pack/week. No alleviating factors noted. Pt denies fever, rhinorrhea, ear pain, body aches, CP, or SOB.   Past Medical History:  Diagnosis Date  . Concussion     There are no active problems to display for this patient.   History reviewed. No pertinent surgical history.  OB History    No data available       Home Medications    Prior to Admission medications   Medication Sig Start Date End Date Taking? Authorizing Provider  benzonatate (TESSALON) 100 MG capsule Take 1 capsule (100 mg total) by mouth 3 (three) times daily as needed for cough. 06/05/16   Gloriann Loan, PA-C  guaiFENesin (MUCINEX) 600 MG 12 hr tablet Take 1 tablet (600 mg total) by mouth 2 (two) times daily. 06/05/16   Gloriann Loan, PA-C  ibuprofen (ADVIL,MOTRIN) 800 MG tablet Take 1 tablet (800 mg total) by mouth 3 (three) times daily. 06/05/16   Gloriann Loan, PA-C  Permethrin & Pyreth-Pip But KIT Use as directed on the package. Repeat in 7 days. Patient not taking: Reported on 05/16/2016 04/11/16   Tereasa Coop, PA-C    Family History Family History  Problem Relation Age of Onset  . Heart disease  Maternal Grandfather   . Hyperlipidemia Maternal Grandfather   . Stroke Maternal Grandfather   . Hyperlipidemia Paternal Grandmother   . Hyperlipidemia Paternal Grandfather     Social History Social History  Substance Use Topics  . Smoking status: Current Every Day Smoker  . Smokeless tobacco: Never Used  . Alcohol use No     Allergies   Patient has no known allergies.   Review of Systems Review of Systems 10 Systems reviewed and are negative for acute change except as noted in the HPI.   Physical Exam Updated Vital Signs BP 103/60 (BP Location: Right Arm)   Pulse 91   Temp 98.8 F (37.1 C) (Oral)   Resp 20   Ht '5\' 1"'  (1.549 m)   Wt 49.4 kg   LMP 04/17/2016   SpO2 100%   BMI 20.60 kg/m   Physical Exam  Constitutional: She is oriented to person, place, and time. She appears well-developed and well-nourished. She is active.  Non-toxic appearance. She does not have a sickly appearance. She does not appear ill.  HENT:  Head: Normocephalic and atraumatic.  Right Ear: Tympanic membrane and external ear normal. Tympanic membrane is not erythematous and not bulging.  Left Ear: Tympanic membrane and external ear normal. Tympanic membrane is not erythematous and not bulging.  Nose: Nose normal.  Mouth/Throat: Uvula is midline, oropharynx is clear and moist and mucous membranes are normal. No  trismus in the jaw. No uvula swelling. No oropharyngeal exudate, posterior oropharyngeal edema, posterior oropharyngeal erythema or tonsillar abscesses.  Neck: Normal range of motion. Neck supple.  No nuchal rigidity.   Cardiovascular: Normal rate and regular rhythm.   Pulmonary/Chest: Effort normal and breath sounds normal. No respiratory distress. She has no wheezes. She has no rales.  Abdominal: Soft. Bowel sounds are normal. She exhibits no distension. There is no tenderness.  Musculoskeletal: Normal range of motion.  Lymphadenopathy:    She has no cervical adenopathy.    Neurological: She is alert and oriented to person, place, and time.  Skin: Skin is warm and dry.  Psychiatric: She has a normal mood and affect. Her behavior is normal.     ED Treatments / Results  DIAGNOSTIC STUDIES:  Oxygen Saturation is 100% on RA, normal by my interpretation.    COORDINATION OF CARE:  12:51 PM Discussed treatment plan with pt at bedside and pt agreed to plan.   Labs (all labs ordered are listed, but only abnormal results are displayed) Labs Reviewed - No data to display  EKG  EKG Interpretation None       Radiology No results found.  Procedures Procedures (including critical care time)  Medications Ordered in ED Medications - No data to display   Initial Impression / Assessment and Plan / ED Course  I have reviewed the triage vital signs and the nursing notes.  Pertinent labs & imaging results that were available during my care of the patient were reviewed by me and considered in my medical decision making (see chart for details).  Clinical Course    Pt symptoms consistent with URI. No indication for imaging at this time.  Initial vitals show HR 140 and temp 100.5; however, recheck 9 minutes later an HR 91 and temp 98.7.  She did not eat/drink or receive medicine in triage.  Question validity of initial vital signs.  She is non-toxic and appears well.  Lungs clear. Pt will be discharged with symptomatic treatment.  Discussed return precautions.  Pt is hemodynamically stable & in NAD prior to discharge.  Final Clinical Impressions(s) / ED Diagnoses   Final diagnoses:  Upper respiratory tract infection, unspecified type    New Prescriptions New Prescriptions   BENZONATATE (TESSALON) 100 MG CAPSULE    Take 1 capsule (100 mg total) by mouth 3 (three) times daily as needed for cough.   GUAIFENESIN (MUCINEX) 600 MG 12 HR TABLET    Take 1 tablet (600 mg total) by mouth 2 (two) times daily.   IBUPROFEN (ADVIL,MOTRIN) 800 MG TABLET    Take 1  tablet (800 mg total) by mouth 3 (three) times daily.   I personally performed the services described in this documentation, which was scribed in my presence. The recorded information has been reviewed and is accurate.     Gloriann Loan, PA-C 06/05/16 [place], MD 06/05/16 [phone]

## 2016-06-11 ENCOUNTER — Encounter (HOSPITAL_COMMUNITY): Payer: Self-pay | Admitting: Emergency Medicine

## 2016-06-11 ENCOUNTER — Ambulatory Visit (HOSPITAL_COMMUNITY)
Admission: EM | Admit: 2016-06-11 | Discharge: 2016-06-11 | Disposition: A | Discharge status: Home / Self Care | Payer: BLUE CROSS/BLUE SHIELD | Attending: Family Medicine | Admitting: Family Medicine

## 2016-06-11 DIAGNOSIS — J069 Acute upper respiratory infection, unspecified: Secondary | ICD-10-CM

## 2016-06-11 DIAGNOSIS — R05 Cough: Secondary | ICD-10-CM | POA: Diagnosis not present

## 2016-06-11 DIAGNOSIS — B9789 Other viral agents as the cause of diseases classified elsewhere: Secondary | ICD-10-CM | POA: Diagnosis not present

## 2016-06-11 DIAGNOSIS — R059 Cough, unspecified: Secondary | ICD-10-CM

## 2016-06-11 NOTE — ED Provider Notes (Signed)
CSN: [id number]     Arrival date & time 06/11/16  1201 History   First MD Initiated Contact with Patient 06/11/16 1227     Chief Complaint  Patient presents with  . Cough   (Consider location/radiation/quality/duration/timing/severity/associated sxs/prior Treatment) 21 yo female presents requesting a note to return to work tomorrow. She has been out of work for a URI diagnosed in the ED on 12/31. Her place of employment requires a return to work note. Her cough has improved. No further fevers or malaise.       Past Medical History:  Diagnosis Date  . Concussion    History reviewed. No pertinent surgical history. Family History  Problem Relation Age of Onset  . Heart disease Maternal Grandfather   . Hyperlipidemia Maternal Grandfather   . Stroke Maternal Grandfather   . Hyperlipidemia Paternal Grandmother   . Hyperlipidemia Paternal Grandfather    Social History  Substance Use Topics  . Smoking status: Current Every Day Smoker  . Smokeless tobacco: Never Used  . Alcohol use No   OB History    No data available     Review of Systems  All other systems reviewed and are negative.   Allergies  Patient has no known allergies.  Home Medications   Prior to Admission medications   Medication Sig Start Date End Date Taking? Authorizing Provider  benzonatate (TESSALON) 100 MG capsule Take 1 capsule (100 mg total) by mouth 3 (three) times daily as needed for cough. 06/05/16   Gloriann Loan, PA-C  guaiFENesin (MUCINEX) 600 MG 12 hr tablet Take 1 tablet (600 mg total) by mouth 2 (two) times daily. 06/05/16   Gloriann Loan, PA-C  ibuprofen (ADVIL,MOTRIN) 800 MG tablet Take 1 tablet (800 mg total) by mouth 3 (three) times daily. 06/05/16   Gloriann Loan, PA-C  Permethrin & Pyreth-Pip But KIT Use as directed on the package. Repeat in 7 days. Patient not taking: Reported on 05/16/2016 04/11/16   Tereasa Coop, PA-C   Meds Ordered and Administered this Visit  Medications - No data to  display  BP 129/79 (BP Location: Left Arm)   Pulse 85   Temp 98.8 F (37.1 C) (Oral)   Resp 16   LMP 04/17/2016   SpO2 99%  No data found.   Physical Exam  Constitutional: She appears well-developed and well-nourished. No distress.  Pulmonary/Chest: Effort normal and breath sounds normal. No respiratory distress. She has no wheezes.  Skin: Skin is warm and dry. She is not diaphoretic.  Psychiatric: Her behavior is normal.  Nursing note and vitals reviewed.   Urgent Care Course   Clinical Course     Procedures (including critical care time)  Labs Review Labs Reviewed - No data to display  Imaging Review No results found.   Visual Acuity Review  Right Eye Distance:   Left Eye Distance:   Bilateral Distance:    Right Eye Near:   Left Eye Near:    Bilateral Near:         MDM   1. Viral upper respiratory tract infection   2. Cough    Improved on previous regimen. 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 F/U here prn.     Bjorn Pippin, PA-C 06/11/16 1236

## 2016-06-11 NOTE — ED Triage Notes (Signed)
[facility] a [facility] needing a work note. The patient stated that she was treated at the ED on 06/05/2016 for a cough and her work has asked that she be cleared and given an additional note to return to work.

## 2016-06-11 NOTE — Discharge Instructions (Signed)
Glad you are feeling better, the cough may last a few weeks. F/U if needed and stay hydrated.

## 2017-10-20 ENCOUNTER — Encounter: Payer: Self-pay | Admitting: Family Medicine

## 2017-10-25 ENCOUNTER — Encounter: Payer: Self-pay | Admitting: Family Medicine

## 2018-07-09 ENCOUNTER — Encounter (HOSPITAL_COMMUNITY): Payer: Self-pay

## 2018-07-09 ENCOUNTER — Ambulatory Visit (HOSPITAL_COMMUNITY)
Admission: EM | Admit: 2018-07-09 | Discharge: 2018-07-09 | Disposition: A | Discharge status: Home / Self Care | Payer: BLUE CROSS/BLUE SHIELD | Attending: Internal Medicine | Admitting: Internal Medicine

## 2018-07-09 DIAGNOSIS — A084 Viral intestinal infection, unspecified: Secondary | ICD-10-CM

## 2018-07-09 MED ORDER — ONDANSETRON 4 MG PO TBDP: 4.0000 mg | ORAL_TABLET | Freq: Three times a day (TID) | ORAL | 0 refills | Status: DC | PRN

## 2018-07-09 NOTE — ED Provider Notes (Signed)
[place]    CSN: [id number] Arrival date & time: 07/09/18  1547     History   Chief Complaint Chief Complaint  Patient presents with  . Influenza    HPI Alexis Hunt is a 23 y.o. female no past medical history comes to the urgent care department on account of 2-day history of diarrhea, nausea and cold sweats.  Patient complains of subjective fever.  She denies any vomiting.  No upper respiratory infection symptoms.  She denies any sore throat, cough or sputum production.  Bowel movements are loose.  She has not tried any over-the-counter medications.  She denies any associated abdominal distention, bloating or cramping.  HPI  Past Medical History:  Diagnosis Date  . Concussion     There are no active problems to display for this patient.   History reviewed. No pertinent surgical history.  OB History   No obstetric history on file.      Home Medications    Prior to Admission medications   Medication Sig Start Date End Date Taking? Authorizing Provider  benzonatate (TESSALON) 100 MG capsule Take 1 capsule (100 mg total) by mouth 3 (three) times daily as needed for cough. 06/05/16   Gloriann Loan, PA-C  guaiFENesin (MUCINEX) 600 MG 12 hr tablet Take 1 tablet (600 mg total) by mouth 2 (two) times daily. 06/05/16   Gloriann Loan, PA-C  ibuprofen (ADVIL,MOTRIN) 800 MG tablet Take 1 tablet (800 mg total) by mouth 3 (three) times daily. 06/05/16   Gloriann Loan, PA-C  ondansetron (ZOFRAN ODT) 4 MG disintegrating tablet Take 1 tablet (4 mg total) by mouth every 8 (eight) hours as needed for nausea or vomiting. 07/09/18   LampteyMyrene Galas, MD  Permethrin & Pyreth-Pip But KIT Use as directed on the package. Repeat in 7 days. Patient not taking: Reported on 05/16/2016 04/11/16   Tereasa Coop, PA-C    Family History Family History  Problem Relation Age of Onset  . Heart disease Maternal Grandfather   . Hyperlipidemia Maternal Grandfather   . Stroke Maternal  Grandfather   . Hyperlipidemia Paternal Grandmother   . Hyperlipidemia Paternal Grandfather     Social History Social History   Tobacco Use  . Smoking status: Current Every Day Smoker  . Smokeless tobacco: Never Used  Substance Use Topics  . Alcohol use: No    Alcohol/week: 0.0 standard drinks  . Drug use: No     Allergies   Patient has no known allergies.   Review of Systems Review of Systems  Constitutional: Positive for activity change, appetite change, chills and fever. Negative for diaphoresis and fatigue.  HENT: Negative for mouth sores, rhinorrhea, sinus pressure and trouble swallowing.   Eyes: Negative for redness.  Respiratory: Negative for cough, chest tightness and shortness of breath.   Gastrointestinal: Positive for diarrhea and nausea. Negative for abdominal distention, abdominal pain and vomiting.  Endocrine: Negative for cold intolerance.  Genitourinary: Negative for difficulty urinating, dysuria and flank pain.  Musculoskeletal: Negative for arthralgias and back pain.  Skin: Negative for color change and pallor.  Neurological: Positive for dizziness, light-headedness and headaches. Negative for syncope.     Physical Exam Triage Vital Signs ED Triage Vitals  Enc Vitals Group     BP 07/09/18 1700 119/81     Pulse Rate 07/09/18 1700 100     Resp 07/09/18 1700 18     Temp 07/09/18 1700 98.6 F (37 C)     Temp Source 07/09/18 1700 Oral  SpO2 07/09/18 1700 100 %     Weight --      Height --      Head Circumference --      Peak Flow --      Pain Score 07/09/18 1704 0     Pain Loc --      Pain Edu? --      Excl. in [place]? --    No data found.  Updated Vital Signs BP 119/81 (BP Location: Left Arm)   Pulse 100   Temp 98.6 F (37 C) (Oral)   Resp 18   LMP 07/06/2018   SpO2 100%   Visual Acuity Right Eye Distance:   Left Eye Distance:   Bilateral Distance:    Right Eye Near:   Left Eye Near:    Bilateral Near:     Physical  Exam Constitutional:      Appearance: Normal appearance. She is ill-appearing.  HENT:     Right Ear: Tympanic membrane and ear canal normal.     Left Ear: Tympanic membrane and ear canal normal.     Nose: Nose normal.     Mouth/Throat:     Mouth: Mucous membranes are moist.     Pharynx: No posterior oropharyngeal erythema.  Eyes:     Conjunctiva/sclera: Conjunctivae normal.  Cardiovascular:     Rate and Rhythm: Normal rate and regular rhythm.  Pulmonary:     Effort: Pulmonary effort is normal. No respiratory distress.     Breath sounds: Normal breath sounds. No wheezing or rales.  Abdominal:     General: Abdomen is flat. Bowel sounds are normal.     Palpations: Abdomen is soft.  Musculoskeletal:        General: No swelling or tenderness.  Skin:    General: Skin is warm and dry.     Capillary Refill: Capillary refill takes less than 2 seconds.  Neurological:     General: No focal deficit present.     Mental Status: She is alert and oriented to person, place, and time.      UC Treatments / Results  Labs (all labs ordered are listed, but only abnormal results are displayed) Labs Reviewed - No data to display  EKG None  Radiology No results found.  Procedures Procedures (including critical care time)  Medications Ordered in UC Medications - No data to display  Initial Impression / Assessment and Plan / UC Course  I have reviewed the triage vital signs and the nursing notes.  Pertinent labs & imaging results that were available during my care of the patient were reviewed by me and considered in my medical decision making (see chart for details).     1.  Acute viral gastroenteritis: Encourage liberal oral fluid intake Zofran for nausea vomiting  Final Clinical Impressions(s) / UC Diagnoses   Final diagnoses:  Viral gastroenteritis   Discharge Instructions   None    ED Prescriptions    Medication Sig Dispense Auth. Provider   ondansetron (ZOFRAN ODT) 4  MG disintegrating tablet Take 1 tablet (4 mg total) by mouth every 8 (eight) hours as needed for nausea or vomiting. 20 tablet Lamptey, Myrene Galas, MD     Controlled Substance Prescriptions [place] Controlled Substance Registry consulted? No   Chase Picket, MD 07/09/18 Lurena Nida

## 2018-07-09 NOTE — ED Triage Notes (Signed)
Pt presents with complaints of diarrhea, nausea and cold sweats x 2 days.

## 2018-10-18 ENCOUNTER — Encounter (HOSPITAL_COMMUNITY): Payer: Self-pay | Admitting: Emergency Medicine

## 2018-10-18 ENCOUNTER — Other Ambulatory Visit: Payer: Self-pay

## 2018-10-18 ENCOUNTER — Ambulatory Visit (HOSPITAL_COMMUNITY)
Admission: EM | Admit: 2018-10-18 | Discharge: 2018-10-18 | Disposition: A | Discharge status: Home / Self Care | Payer: BLUE CROSS/BLUE SHIELD | Attending: Family Medicine | Admitting: Family Medicine

## 2018-10-18 DIAGNOSIS — N939 Abnormal uterine and vaginal bleeding, unspecified: Secondary | ICD-10-CM | POA: Diagnosis not present

## 2018-10-18 LAB — POCT PREGNANCY, URINE: Preg Test, Ur: NEGATIVE

## 2018-10-18 MED ORDER — MEGESTROL ACETATE 40 MG PO TABS: 40.0000 mg | ORAL_TABLET | Freq: Two times a day (BID) | ORAL | 0 refills | Status: AC

## 2018-10-18 NOTE — ED Notes (Signed)
Went to obtain CBC, upon walking into the patient's room, she announced that "we would not be drawing blood". I immediately left the room and reported statement to the provider

## 2018-10-18 NOTE — ED Provider Notes (Signed)
[facility]   [id number] 10/18/18 Arrival Time: 1504  ASSESSMENT & PLAN:  1. Abnormal uterine bleeding (AUB)    Declines CBC. UPT negative. See AVS for written d/c information.  Meds ordered this encounter  Medications  . megestrol (MEGACE) 40 MG tablet    Sig: Take 1 tablet (40 mg total) by mouth 2 (two) times daily for 14 days.    Dispense:  28 tablet    Refill:  0    Recommend: Follow-up Information    Schedule an appointment as soon as possible for a [facility]   Contact information: [address] [place] [phone]       [facility].   Specialty:  Emergency Medicine Why:  If symptoms worsen in any way. Contact information: [address] [id number] [place] [phone]         Reviewed expectations re: course of current medical issues. Questions answered. Outlined signs and symptoms indicating need for more acute intervention. Patient verbalized understanding. After Visit Summary given.   SUBJECTIVE:  Alexis Hunt is a 23 y.o. female who presents with complaint of vaginal bleeding. Irregular menstrual periods. Feels she had a period two weeks ago but has started having another over the past few days. No vaginal discharge. Not having to change pads secondary to soaking them. No lightheadedness. Seen at another facility and given progesterone which helped. No urinary symptoms. No back/flank pain/abd pain. Ambulatory without difficulty. Normal PO intake. No n/v. No rashes or lesions. Patient's last menstrual period was 10/04/2018.  ROS: As per HPI. All other systems negative.   OBJECTIVE:  Vitals:   10/18/18 1545  BP: 125/72  Pulse: (!) 121  Resp: 14  Temp: 98.8 F (37.1 C)  TempSrc: Oral  SpO2: 100%    General appearance: alert, cooperative, appears stated age and no distress HEENT: conjunctivae normal  Throat: lips, mucosa, and tongue normal; teeth and gums normal CV: RRR Lungs: CTAB Back: no CVA tenderness; FROM at waist Abdomen: soft, non-tender; no guarding or rebound tenderness GU: deferred Skin: warm and dry Psychological: alert and cooperative; normal mood and affect.  Results for orders placed or performed during the hospital encounter of 10/18/18  Pregnancy, urine POC  Result Value Ref Range   Preg Test, Ur NEGATIVE NEGATIVE    Labs Reviewed  POC URINE PREG, ED  POCT PREGNANCY, URINE    No Known Allergies  Past Medical History:  Diagnosis Date  . Concussion    Family History  Problem Relation Age of Onset  . Heart disease Maternal Grandfather   . Hyperlipidemia Maternal Grandfather   . Stroke Maternal Grandfather   . Hyperlipidemia Paternal Grandmother   . Hyperlipidemia Paternal Grandfather    Social History   Socioeconomic History  . Marital status: Single    Spouse name: Not on file  . Number of children: Not on file  . Years of education: Not on file  . Highest education level: Not on file  Occupational History  . Not on file  Social Needs  . Financial resource strain: Not on file  . Food insecurity:    Worry: Not on file    Inability: Not on file  . Transportation needs:    Medical: Not on file    Non-medical: Not on file  Tobacco Use  . Smoking status: Current Every Day Smoker  . Smokeless tobacco: Never Used  Substance and Sexual Activity  .  Alcohol use: No    Alcohol/week: 0.0 standard drinks  . Drug use: No  . Sexual activity: Yes    Birth control/protection: None  Lifestyle  . Physical activity:    Days per week: Not on file    Minutes per session: Not on file  . Stress: Not on file  Relationships  . Social connections:    Talks on phone: Not on file    Gets together: Not on file    Attends religious service: Not on file    Active member of club or organization: Not on file    Attends meetings of clubs or organizations: Not  on file    Relationship status: Not on file  . Intimate partner violence:    Fear of current or ex partner: Not on file    Emotionally abused: Not on file    Physically abused: Not on file    Forced sexual activity: Not on file  Other Topics Concern  . Not on file  Social History Narrative  . Not on file          Mardella Layman, MD 10/31/18 [phone]

## 2018-10-18 NOTE — ED Triage Notes (Signed)
Patient has started period 2 weeks early and has had bleeding for 2 weeks.  [facility] started on medicine to stop period, and period did stop for only 2 days .  Bleeding restarted.  Patient denies back or abdominal pain

## 2018-12-06 ENCOUNTER — Encounter (HOSPITAL_COMMUNITY): Payer: Self-pay

## 2018-12-06 ENCOUNTER — Ambulatory Visit: Payer: Self-pay

## 2018-12-06 ENCOUNTER — Other Ambulatory Visit: Payer: Self-pay

## 2018-12-06 ENCOUNTER — Ambulatory Visit (HOSPITAL_COMMUNITY)
Admission: EM | Admit: 2018-12-06 | Discharge: 2018-12-06 | Disposition: A | Discharge status: Home / Self Care | Payer: BC Managed Care – PPO | Attending: Family Medicine | Admitting: Family Medicine

## 2018-12-06 DIAGNOSIS — R000 Tachycardia, unspecified: Secondary | ICD-10-CM

## 2018-12-06 DIAGNOSIS — F411 Generalized anxiety disorder: Secondary | ICD-10-CM

## 2018-12-06 MED ORDER — HYDROXYZINE HCL 10 MG PO TABS: 10.0000 mg | ORAL_TABLET | ORAL | 0 refills | Status: DC | PRN

## 2018-12-06 NOTE — ED Provider Notes (Addendum)
[facility]    CSN: [id number] Arrival date & time: 12/06/18  1333      History   Chief Complaint Chief Complaint  Patient presents with  . Tachycardia    HPI Alexis Hunt is a 23 y.o. female.   HPI  Patient has been having nausea and vomiting for the last 2 days.  Some fatigue.  She feels like it may be anxiety.  She has had no fever chills.  No diarrhea.  No abdominal pain.  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 She went to an urgent care yesterday for the nausea and vomiting.  She was given Zofran.  They also did COVID-19 testing.  Results not yet available.  She took Zofran last night did not help the nausea.  She was able to drink some fluids. This morning when she woke up she had tachycardia.  She states she used to suffer from anxiety.  She frequently would have anxiety attacks when she first woke up in the morning or during the night.  This feels slightly different and that she does not have as much shortness of breath.  She states if she lays down or closes her eyes she can make the tachycardia come back, then it returns with activity. No chest pain.  No dizziness.  No shortness of breath.  No underlying heart condition.  Past Medical History:  Diagnosis Date  . Concussion     There are no active problems to display for this patient.   History reviewed. No pertinent surgical history.  OB History   No obstetric history on file.      Home Medications    Prior to Admission medications   Medication Sig Start Date End Date Taking? Authorizing Provider  ondansetron (ZOFRAN-ODT) 4 MG disintegrating tablet  12/05/18  Yes [provider]  hydrOXYzine (ATARAX/VISTARIL) 10 MG tablet Take 1 tablet (10 mg total) by mouth every 4 (four) hours as needed. 12/06/18   Eustace MooreNelson, Shatima Zalar Sue, MD  ibuprofen (ADVIL,MOTRIN) 800 MG tablet Take 1 tablet (800 mg total) by mouth 3 (three)  times daily. 06/05/16   Cheri Fowlerose, Kayla, PA-C  NON FORMULARY Antibiotic for uti    [provider]    Family History Family History  Problem Relation Age of Onset  . Healthy Mother   . Healthy Father   . Heart disease Maternal Grandfather   . Hyperlipidemia Maternal Grandfather   . Stroke Maternal Grandfather   . Hyperlipidemia Paternal Grandmother   . Hyperlipidemia Paternal Grandfather     Social History Social History   Tobacco Use  . Smoking status: Current Every Day Smoker    Packs/day: 0.25    Types: Cigarettes  . Smokeless tobacco: Never Used  Substance Use Topics  . Alcohol use: No    Alcohol/week: 0.0 standard drinks  . Drug use: No     Allergies   Patient has no known allergies.   Review of Systems Review of Systems  Constitutional: Positive for fatigue. Negative for chills and fever.  HENT: Negative for ear pain and sore throat.   Eyes: Negative for pain and visual disturbance.  Respiratory: Negative for cough and shortness of breath.   Cardiovascular: Positive for palpitations. Negative for chest pain.  Gastrointestinal: Positive for nausea and vomiting. Negative for abdominal pain.  Genitourinary: Negative for dysuria and hematuria.  Musculoskeletal: Negative for arthralgias and back pain.  Skin: Negative for  color change and rash.  Neurological: Negative for seizures and syncope.  Psychiatric/Behavioral: The patient is nervous/anxious.   All other systems reviewed and are negative.    Physical Exam Triage Vital Signs ED Triage Vitals  Enc Vitals Group     BP 12/06/18 1401 131/80     Pulse Rate 12/06/18 1401 (!) 105     Resp 12/06/18 1401 20     Temp 12/06/18 1401 99 F (37.2 C)     Temp Source 12/06/18 1401 Oral     SpO2 12/06/18 1401 99 %     Weight --      Height --      Head Circumference --      Peak Flow --      Pain Score 12/06/18 1358 0     Pain Loc --      Pain Edu? --      Excl. in [place]? --    No data found.  Updated  Vital Signs BP 131/80 (BP Location: Right Arm)   Pulse (!) 105   Temp 99 F (37.2 C) (Oral)   Resp 20   SpO2 99%      Physical Exam Constitutional:      General: She is not in acute distress.    Appearance: She is well-developed and normal weight.  HENT:     Head: Normocephalic and atraumatic.     Nose: Nose normal. No congestion.     Mouth/Throat:     Mouth: Mucous membranes are moist.     Pharynx: No posterior oropharyngeal erythema.  Eyes:     Conjunctiva/sclera: Conjunctivae normal.     Pupils: Pupils are equal, round, and reactive to light.  Neck:     Musculoskeletal: Normal range of motion and neck supple.  Cardiovascular:     Rate and Rhythm: Normal rate and regular rhythm.     Heart sounds: Normal heart sounds.     Comments: Sinus arrhythmia is noted Pulmonary:     Effort: Pulmonary effort is normal. No respiratory distress.     Breath sounds: Normal breath sounds. No rales.  Abdominal:     General: Abdomen is flat. There is no distension.     Palpations: Abdomen is soft.  Musculoskeletal: Normal range of motion.  Lymphadenopathy:     Cervical: No cervical adenopathy.  Skin:    General: Skin is warm and dry.  Neurological:     General: No focal deficit present.     Mental Status: She is alert.  Psychiatric:        Thought Content: Thought content normal.        Judgment: Judgment normal.     Comments: Slightly anxious, labile      UC Treatments / Results  Labs (all labs ordered are listed, but only abnormal results are displayed) Labs Reviewed - No data to display  EKG= normal rate, 79.  Sinus arrhythmia noted.  Normal intervals.  No ST or T wave changes   Radiology No results found.  Procedures Procedures (including critical care time)  Medications Ordered in UC Medications - No data to display  Initial Impression / Assessment and Plan / UC Course  I have reviewed the triage vital signs and the nursing notes.  Pertinent labs & imaging  results that were available during my care of the patient were reviewed by me and considered in my medical decision making (see chart for details).     Discussed with COVID testing, she needs to quarantine until  the results are back.  Also with nausea vomiting fatigue it is not a high risk symptom complex, she still needs to stay home a few days.  She agrees. We discussed tachycardia.  Written information was given to her. Follow-up with PCP Final Clinical Impressions(s) / UC Diagnoses   Final diagnoses:  Anxiety state  Tachycardia     Discharge Instructions     Take hydroxyzine as needed for anxiety Quarantine yourself at home until the COVID test is back Avoid caffeine   ED Prescriptions    Medication Sig Dispense Auth. Provider   hydrOXYzine (ATARAX/VISTARIL) 10 MG tablet Take 1 tablet (10 mg total) by mouth every 4 (four) hours as needed. 40 tablet Eustace MooreNelson, Loyda Costin Sue, MD     Controlled Substance Prescriptions [place] Controlled Substance Registry consulted? Not Applicable   Eustace MooreNelson, Isaid Salvia Sue, MD 12/06/18 1502    Eustace MooreNelson, Shamariah Shewmake Sue, MD 12/06/18 [phone]

## 2018-12-06 NOTE — ED Triage Notes (Signed)
Patient presents to Urgent Care with complaints of feeling like her heart is racing since this morning when she took zofran for nausea she was given earlier in the week. Patient reports she only took one zofran, pt appears anxious during triage.

## 2018-12-06 NOTE — Telephone Encounter (Signed)
Pt called stating that she went to a urgent care yesterday because she was vomiting and nauseous. She states that the gave her Zofran and sent her home. The checked her temperature and did a COVID-19 test. She states that today her nauesea is gone but she feels like her heat is racing and her stomach is nervious. Her HR 100 while speaking with me. Pt will go back to UC or ER for evaluation of symptom. Care advice read to patient. Patient verbalized understanding of all instructions. Reason for Disposition . [1] Heart beating very rapidly (e.g., > 140 / minute) AND [2] not present now  (Exception: during exercise)  Answer Assessment - Initial Assessment Questions 1. DESCRIPTION: "Please describe your heart rate or heart beat that you are having" (e.g., fast/slow, regular/irregular, skipped or extra beats, "palpitations")     fast 2. ONSET: "When did it start?" (Minutes, hours or days)      today 3. DURATION: "How long does it last" (e.g., seconds, minutes, hours)     9am 4. PATTERN "Does it come and go, or has it been constant since it started?"  "Does it get worse with exertion?"   "Are you feeling it now?"     Goes whensleeping 5. TAP: "Using your hand, can you tap out what you are feeling on a chair or table in front of you, so that I can hear?" (Note: not all patients can do this)       Normal  6. HEART RATE: "Can you tell me your heart rate?" "How many beats in 15 seconds?"  (Note: not all patients can do this)       100 7. RECURRENT SYMPTOM: "Have you ever had this before?" If so, ask: "When was the last time?" and "What happened that time?"      No 8. CAUSE: "What do you think is causing the palpitations?"     zofran for nausea and vomiting 9. CARDIAC HISTORY: "Do you have any history of heart disease?" (e.g., heart attack, angina, bypass surgery, angioplasty, arrhythmia)      no 10. OTHER SYMPTOMS: "Do you have any other symptoms?" (e.g., dizziness, chest pain, sweating, difficulty  breathing)       no 11. PREGNANCY: "Is there any chance you are pregnant?" "When was your last menstrual period?"       1 month ago  Protocols used: [place]

## 2018-12-06 NOTE — Discharge Instructions (Addendum)
Take hydroxyzine as needed for anxiety Quarantine yourself at home until the COVID test is back Avoid caffeine

## 2018-12-07 ENCOUNTER — Emergency Department (HOSPITAL_COMMUNITY)
Admission: EM | Admit: 2018-12-07 | Discharge: 2018-12-07 | Disposition: A | Discharge status: Home / Self Care | Payer: BC Managed Care – PPO | Attending: Emergency Medicine | Admitting: Emergency Medicine

## 2018-12-07 ENCOUNTER — Other Ambulatory Visit: Payer: Self-pay

## 2018-12-07 ENCOUNTER — Encounter (HOSPITAL_COMMUNITY): Payer: Self-pay

## 2018-12-07 DIAGNOSIS — R110 Nausea: Secondary | ICD-10-CM | POA: Insufficient documentation

## 2018-12-07 DIAGNOSIS — F17210 Nicotine dependence, cigarettes, uncomplicated: Secondary | ICD-10-CM | POA: Insufficient documentation

## 2018-12-07 MED ORDER — PANTOPRAZOLE SODIUM 20 MG PO TBEC: 20.0000 mg | DELAYED_RELEASE_TABLET | Freq: Every day | ORAL | 0 refills | Status: DC

## 2018-12-07 NOTE — ED Notes (Signed)
ED Provider at bedside. 

## 2018-12-07 NOTE — ED Triage Notes (Signed)
Patient c/o nausea 3 days ago. Patient went to an UC and was given a prescription for zofran. patient states yesterday she had anxiety and was given a prescription for Vistaril. Patient states she feels less anxious. Today, she had increased nausea and anxiety. Patient states she took her meds and feels fine today, but when she wakes up she has anxiety and nausea.

## 2018-12-07 NOTE — Discharge Instructions (Addendum)
Follow-up with your primary care doctor in the next couple weeks

## 2018-12-07 NOTE — ED Provider Notes (Signed)
[facility] DEPT Provider Note   CSN: [id number] Arrival date & time: 12/07/18  1813     History   Chief Complaint Chief Complaint  Patient presents with  . Nausea  . Anxiety    HPI Mearl Harewood is a 23 y.o. female.     Patient states she has been having nausea but her pregnancy test was negative.  It improved with Zofran and her anxiety improved with Vistaril.  Patient was concerned that she may need to have a test for COVID  The history is provided by the patient.  Anxiety This is a recurrent problem. The current episode started yesterday. The problem occurs rarely. The problem has been resolved. Pertinent negatives include no chest pain, no abdominal pain and no headaches. Nothing aggravates the symptoms. Nothing relieves the symptoms. She has tried nothing for the symptoms. The treatment provided mild relief.    Past Medical History:  Diagnosis Date  . Concussion     There are no active problems to display for this patient.   History reviewed. No pertinent surgical history.   OB History   No obstetric history on file.      Home Medications    Prior to Admission medications   Medication Sig Start Date End Date Taking? Authorizing Provider  hydrOXYzine (ATARAX/VISTARIL) 10 MG tablet Take 1 tablet (10 mg total) by mouth every 4 (four) hours as needed. 12/06/18   Raylene Everts, MD  ibuprofen (ADVIL,MOTRIN) 800 MG tablet Take 1 tablet (800 mg total) by mouth 3 (three) times daily. 06/05/16   Gloriann Loan, PA-C  NON FORMULARY Antibiotic for uti    [provider]  ondansetron (ZOFRAN-ODT) 4 MG disintegrating tablet  12/05/18   [provider]  pantoprazole (PROTONIX) 20 MG tablet Take 1 tablet (20 mg total) by mouth daily. 12/07/18   Milton Ferguson, MD    Family History Family History  Problem Relation Age of Onset  . Healthy Mother   . Healthy Father   . Heart disease Maternal Grandfather   . Hyperlipidemia  Maternal Grandfather   . Stroke Maternal Grandfather   . Hyperlipidemia Paternal Grandmother   . Hyperlipidemia Paternal Grandfather     Social History Social History   Tobacco Use  . Smoking status: Current Every Day Smoker    Packs/day: 0.25    Types: Cigarettes  . Smokeless tobacco: Never Used  Substance Use Topics  . Alcohol use: No    Alcohol/week: 0.0 standard drinks  . Drug use: No     Allergies   Patient has no known allergies.   Review of Systems Review of Systems  Constitutional: Negative for appetite change and fatigue.  HENT: Negative for congestion, ear discharge and sinus pressure.   Eyes: Negative for discharge.  Respiratory: Negative for cough.   Cardiovascular: Negative for chest pain.  Gastrointestinal: Positive for nausea. Negative for abdominal pain and diarrhea.  Genitourinary: Negative for frequency and hematuria.  Musculoskeletal: Negative for back pain.  Skin: Negative for rash.  Neurological: Negative for seizures and headaches.  Psychiatric/Behavioral: Negative for hallucinations.     Physical Exam Updated Vital Signs BP 138/86 (BP Location: Left Arm)   Pulse (!) 132   Temp 99.2 F (37.3 C) (Oral)   Resp 18   Ht 5\' 1"  (1.549 m)   Wt 47.6 kg   LMP 12/07/2018   SpO2 99%   BMI 19.84 kg/m   Physical Exam Vitals signs and nursing note reviewed.  Constitutional:  Appearance: She is well-developed.  HENT:     Head: Normocephalic.     Nose: Nose normal.  Eyes:     General: No scleral icterus.    Conjunctiva/sclera: Conjunctivae normal.  Neck:     Musculoskeletal: Neck supple.     Thyroid: No thyromegaly.  Cardiovascular:     Rate and Rhythm: Normal rate and regular rhythm.     Heart sounds: No murmur. No friction rub. No gallop.   Pulmonary:     Breath sounds: No stridor. No wheezing or rales.  Chest:     Chest wall: No tenderness.  Abdominal:     General: There is no distension.     Tenderness: There is no abdominal  tenderness. There is no rebound.  Musculoskeletal: Normal range of motion.  Lymphadenopathy:     Cervical: No cervical adenopathy.  Skin:    Findings: No erythema or rash.  Neurological:     Mental Status: She is oriented to person, place, and time.     Motor: No abnormal muscle tone.     Coordination: Coordination normal.  Psychiatric:        Behavior: Behavior normal.      ED Treatments / Results  Labs (all labs ordered are listed, but only abnormal results are displayed) Labs Reviewed - No data to display  EKG None  Radiology No results found.  Procedures Procedures (including critical care time)  Medications Ordered in ED Medications - No data to display   Initial Impression / Assessment and Plan / ED Course  I have reviewed the triage vital signs and the nursing notes.  Pertinent labs & imaging results that were available during my care of the patient were reviewed by me and considered in my medical decision making (see chart for details).       Patient with mild nausea.  Patient doing well with Zofran.  She will placed on Protonix and will follow-up with PCP  Final Clinical Impressions(s) / ED Diagnoses   Final diagnoses:  Nausea    ED Discharge Orders         Ordered    pantoprazole (PROTONIX) 20 MG tablet  Daily     12/07/18 1857           Bethann BerkshireZammit, Manning Luna, MD 12/07/18 1900

## 2018-12-08 ENCOUNTER — Other Ambulatory Visit: Payer: Self-pay

## 2018-12-08 ENCOUNTER — Emergency Department (HOSPITAL_COMMUNITY)
Admission: EM | Admit: 2018-12-08 | Discharge: 2018-12-08 | Disposition: A | Discharge status: Home / Self Care | Payer: BC Managed Care – PPO | Attending: Emergency Medicine | Admitting: Emergency Medicine

## 2018-12-08 ENCOUNTER — Encounter (HOSPITAL_COMMUNITY): Payer: Self-pay

## 2018-12-08 DIAGNOSIS — R110 Nausea: Secondary | ICD-10-CM | POA: Diagnosis not present

## 2018-12-08 DIAGNOSIS — F419 Anxiety disorder, unspecified: Secondary | ICD-10-CM

## 2018-12-08 DIAGNOSIS — F17210 Nicotine dependence, cigarettes, uncomplicated: Secondary | ICD-10-CM | POA: Diagnosis not present

## 2018-12-08 LAB — PREGNANCY, URINE: Preg Test, Ur: NEGATIVE

## 2018-12-08 MED ORDER — LORAZEPAM 1 MG PO TABS
1.0000 mg | ORAL_TABLET | Freq: Once | ORAL | Status: AC
  Administered 2018-12-08: 16:00:00 1 mg via ORAL
  Filled 2018-12-08: qty 1

## 2018-12-08 MED ORDER — LORAZEPAM 1 MG PO TABS: 1.0000 mg | ORAL_TABLET | Freq: Three times a day (TID) | ORAL | 0 refills | Status: DC | PRN

## 2018-12-08 NOTE — ED Triage Notes (Addendum)
Patient states Tuesday she woke up "really nuascious and dry heaving".   Wednesday patient went to urgent care and given Zofran.   Thursday morning when patient woke up her heart was racing fast. Patient went to [place] urgent care and was given hydroxyzine for anxiety that did help.   Friday patient woke up with anxiety, nausea and vomiting. Patient came to [facility] and was given  Protonix to decrease the acid on stomach.   This morning patient woke up and ate and felt fine went to sleep for another hour woke back up and started vomiting and having nasuea again. Patient took the Zofran and has not had relief since.   Patient states she thinks this all could be just her anxiety.   A/Ox4 Ambulatory in triage.   Patient states she has a lot going on and always has had issues with her anxiety.   Patient states she just feels scared.   Patient states she was in [employer] in 2015 and was seen by MD after experiencing anxiety and was also given hydroxyzine.

## 2018-12-08 NOTE — ED Provider Notes (Signed)
[facility]-EMERGENCY DEPT Provider Note   CSN: [id number] Arrival date & time: 12/08/18  1257    History   Chief Complaint Chief Complaint  Patient presents with  . Anxiety  . Nausea    HPI Alexis Hunt is a 23 y.o. female with past medical history of anxiety, presenting to the emergency department with complaint of anxiety.  Patient states she has had intermittent problems with anxiety since she was a child.  She states as a child she would hyperventilate and then passed out.  She states she also had issues when she joined the [employer] and was given hydroxyzine which provided significant improvement in her symptoms.  She states this most recent episode began the beginning of the week.  She has a really sensation she feels on her stomach that is worse upon awakening in the morning.  She is developed some nausea with this that occurs as she wakes up when she feels the nervousness.  She also feels heart racing.  She states she has had a few episodes of vomiting.  She was prescribed Zofran by urgent care which provides relief of her nausea vomiting the morning.  The hydroxyzine she was prescribed is providing some relief, however does not resolve her symptoms.  She states she is stressed out with work and concern for having the COVID-19 virus is also on her mind.  Denies depression, SI, HI, AVH.  She is currently menstruating therefore has some lower menstrual cramping, however no other abdominal pain, fever, or other symptoms.     The history is provided by the patient.    Past Medical History:  Diagnosis Date  . Concussion     There are no active problems to display for this patient.   History reviewed. No pertinent surgical history.   OB History   No obstetric history on file.      Home Medications    Prior to Admission medications   Medication Sig Start Date End Date Taking? Authorizing Provider  hydrOXYzine (ATARAX/VISTARIL) 10 MG tablet Take 1  tablet (10 mg total) by mouth every 4 (four) hours as needed. 12/06/18   Eustace MooreNelson, Yvonne Sue, MD  ibuprofen (ADVIL,MOTRIN) 800 MG tablet Take 1 tablet (800 mg total) by mouth 3 (three) times daily. 06/05/16   Cheri Fowlerose, Kayla, PA-C  NON FORMULARY Antibiotic for uti    [provider]  ondansetron (ZOFRAN-ODT) 4 MG disintegrating tablet  12/05/18   [provider]  pantoprazole (PROTONIX) 20 MG tablet Take 1 tablet (20 mg total) by mouth daily. 12/07/18   Bethann BerkshireZammit, Joseph, MD    Family History Family History  Problem Relation Age of Onset  . Healthy Mother   . Healthy Father   . Heart disease Maternal Grandfather   . Hyperlipidemia Maternal Grandfather   . Stroke Maternal Grandfather   . Hyperlipidemia Paternal Grandmother   . Hyperlipidemia Paternal Grandfather     Social History Social History   Tobacco Use  . Smoking status: Current Every Day Smoker    Packs/day: 0.25    Types: Cigarettes  . Smokeless tobacco: Never Used  Substance Use Topics  . Alcohol use: No    Alcohol/week: 0.0 standard drinks  . Drug use: No     Allergies   Patient has no known allergies.   Review of Systems Review of Systems  Gastrointestinal: Positive for nausea and vomiting.  Psychiatric/Behavioral: The patient is nervous/anxious.   All other systems reviewed and are negative.    Physical Exam  Updated Vital Signs BP 123/84 (BP Location: Right Arm)   Pulse (!) 115   Temp 98.3 F (36.8 C) (Oral)   Resp 18   LMP 12/07/2018   SpO2 100%   Physical Exam Vitals signs and nursing note reviewed.  Constitutional:      Appearance: She is well-developed.  HENT:     Head: Normocephalic and atraumatic.  Eyes:     Conjunctiva/sclera: Conjunctivae normal.  Cardiovascular:     Rate and Rhythm: Regular rhythm. Tachycardia present.  Pulmonary:     Effort: Pulmonary effort is normal. No respiratory distress.     Breath sounds: Normal breath sounds.  Abdominal:     General: Bowel sounds  are normal.     Palpations: Abdomen is soft.     Tenderness: There is no abdominal tenderness. There is no guarding or rebound.  Skin:    General: Skin is warm.  Neurological:     Mental Status: She is alert.  Psychiatric:        Attention and Perception: Attention normal.        Mood and Affect: Mood is anxious. Affect is tearful.        Speech: Speech normal.        Behavior: Behavior normal. Behavior is cooperative.        Thought Content: Thought content does not include homicidal or suicidal ideation.      ED Treatments / Results  Labs (all labs ordered are listed, but only abnormal results are displayed) Labs Reviewed  PREGNANCY, URINE  POC URINE PREG, ED    EKG EKG Interpretation  Date/Time:  Saturday December 08 2018 13:58:36 EDT Ventricular Rate:  89 PR Interval:    QRS Duration: 87 QT Interval:  386 QTC Calculation: 470 R Axis:   67 Text Interpretation:  Sinus rhythm Confirmed by Julianne Rice [phone]) on 12/08/2018 2:45:04 PM   Radiology No results found.  Procedures Procedures (including critical care time)  Medications Ordered in ED Medications  LORazepam (ATIVAN) tablet 1 mg (has no administration in time range)     Initial Impression / Assessment and Plan / ED Course  I have reviewed the triage vital signs and the nursing notes.  Pertinent labs & imaging results that were available during my care of the patient were reviewed by me and considered in my medical decision making (see chart for details).        Patient with history of anxiety, presenting with recurrent symptoms for 1 week now.  She states she wakes up very anxious which causes her to have nausea and some vomiting - no abd pain.  She was seen by urgent care in the ED this week for the symptoms.  She was prescribed hydroxyzine and Zofran.  The Zofran relieves her nausea vomiting the morning, however the hydroxyzine is providing some relief, however not enough.  She does not currently see  a psychiatrist.  She denies depression, SI, HI, AVH.  She appears anxious and tearful on exam, however is redirectable.  She is slightly tachycardic, however this is likely related to the anxiety.  EKG is normal.  Urine pregnancy is negative.  No hx of substance abuse. Given multiple visits this week, feel it is reasonable to escalate her anxiety treatment at this time. Will provide short course of ativan to use as needed for severe anxiety.  Encourage psychiatry follow-up for further management.  Patient is agreeable to plan and safe for discharge at this time.  Discussed results, findings, treatment  and follow up. Patient advised of return precautions. Patient verbalized understanding and agreed with plan.  [place] Controlled Substance reporting System queried   Final Clinical Impressions(s) / ED Diagnoses   Final diagnoses:  Anxiety  Nausea    ED Discharge Orders    None       Kendrick Remigio, [place] N, PA-C 12/08/18 1536    Loren RacerYelverton, David, MD 12/09/18 1757

## 2018-12-08 NOTE — Discharge Instructions (Addendum)
You can take the lorazepam every 8 hours as needed for significant anxiety. For mild to moderate anxiety, continue taking the hydroxyzine as prescribed. Establish care with a psychiatrist for further management of your anxiety. Return to the ER if you develop thoughts or feelings of harming yourself or others, or for new or concerning symptoms.

## 2019-04-09 ENCOUNTER — Encounter (HOSPITAL_COMMUNITY): Payer: Self-pay | Admitting: Emergency Medicine

## 2019-04-09 ENCOUNTER — Ambulatory Visit (HOSPITAL_COMMUNITY)
Admission: EM | Admit: 2019-04-09 | Discharge: 2019-04-09 | Disposition: A | Discharge status: Home / Self Care | Payer: BC Managed Care – PPO | Attending: Urgent Care | Admitting: Urgent Care

## 2019-04-09 ENCOUNTER — Other Ambulatory Visit: Payer: Self-pay

## 2019-04-09 DIAGNOSIS — Z3201 Encounter for pregnancy test, result positive: Secondary | ICD-10-CM | POA: Insufficient documentation

## 2019-04-09 DIAGNOSIS — Z3490 Encounter for supervision of normal pregnancy, unspecified, unspecified trimester: Secondary | ICD-10-CM | POA: Insufficient documentation

## 2019-04-09 DIAGNOSIS — N3001 Acute cystitis with hematuria: Secondary | ICD-10-CM | POA: Diagnosis not present

## 2019-04-09 LAB — POCT URINALYSIS DIP (DEVICE)
Bilirubin Urine: NEGATIVE
Glucose, UA: NEGATIVE mg/dL
Ketones, ur: 15 mg/dL — AB
Nitrite: POSITIVE — AB
Protein, ur: 100 mg/dL — AB
Specific Gravity, Urine: 1.025 (ref 1.005–1.030)
Urobilinogen, UA: 0.2 mg/dL (ref 0.0–1.0)
pH: 5.5 (ref 5.0–8.0)

## 2019-04-09 LAB — POCT PREGNANCY, URINE: Preg Test, Ur: POSITIVE — AB

## 2019-04-09 MED ORDER — NITROFURANTOIN MONOHYD MACRO 100 MG PO CAPS: 100.0000 mg | ORAL_CAPSULE | Freq: Two times a day (BID) | ORAL | 0 refills | Status: DC

## 2019-04-09 NOTE — ED Provider Notes (Signed)
MRN: [id number] DOB: 1996/04/09  Subjective:   Alexis Hunt is a 23 y.o. female presenting for 2 week history of urinary frequency. In the past day, started having right lower flank/low back pain worse with movement and coughing. Has a hx of pyelonephritis. LMP was 03/12/2019, was regular.  She did have unprotected sex and is unsure about pregnancy.  She does not use any contraception.  Regarding her tachycardia, has been worked up extensively before and was told that her tachycardia can happen with anxiety.  Patient has had multiple EKGs and work-up done which was negative.  She does admit ongoing anxiety and uses hydroxyzine as needed for this.  Has taken APAP, hydroxyzine. Use ibuprofen as needed.    No Known Allergies  Past Medical History:  Diagnosis Date  . Concussion      History reviewed. No pertinent surgical history.  Social History   Socioeconomic History  . Marital status: Single    Spouse name: Not on file  . Number of children: Not on file  . Years of education: Not on file  . Highest education level: Not on file  Occupational History  . Not on file  Social Needs  . Financial resource strain: Not on file  . Food insecurity    Worry: Not on file    Inability: Not on file  . Transportation needs    Medical: Not on file    Non-medical: Not on file  Tobacco Use  . Smoking status: Current Every Day Smoker    Packs/day: 0.25    Types: Cigarettes  . Smokeless tobacco: Never Used  Substance and Sexual Activity  . Alcohol use: No    Alcohol/week: 0.0 standard drinks  . Drug use: No  . Sexual activity: Yes    Birth control/protection: None  Lifestyle  . Physical activity    Days per week: Not on file    Minutes per session: Not on file  . Stress: Not on file  Relationships  . Social Musician on phone: Not on file    Gets together: Not on file    Attends religious service: Not on file    Active member of club or organization: Not on file   Attends meetings of clubs or organizations: Not on file    Relationship status: Not on file  . Intimate partner violence    Fear of current or ex partner: Not on file    Emotionally abused: Not on file    Physically abused: Not on file    Forced sexual activity: Not on file  Other Topics Concern  . Not on file  Social History Narrative  . Not on file    Family History  Problem Relation Age of Onset  . Healthy Mother   . Healthy Father   . Heart disease Maternal Grandfather   . Hyperlipidemia Maternal Grandfather   . Stroke Maternal Grandfather   . Hyperlipidemia Paternal Grandmother   . Hyperlipidemia Paternal Grandfather      ROS  Objective:   Vitals: BP 129/87   Pulse (!) 130   Temp 99.4 F (37.4 C) (Oral)   Resp 18   LMP 03/12/2019   SpO2 98%   Pulse was 80 on recheck by PA Jhoselyn Ruffini.  Physical Exam Constitutional:      General: She is not in acute distress.    Appearance: Normal appearance. She is well-developed and normal weight. She is not ill-appearing, toxic-appearing or diaphoretic.  HENT:  Head: Normocephalic and atraumatic.     Right Ear: External ear normal.     Left Ear: External ear normal.     Nose: Nose normal.     Mouth/Throat:     Mouth: Mucous membranes are moist.     Pharynx: Oropharynx is clear.  Eyes:     General: No scleral icterus.    Extraocular Movements: Extraocular movements intact.     Pupils: Pupils are equal, round, and reactive to light.  Cardiovascular:     Rate and Rhythm: Regular rhythm. Tachycardia present.     Heart sounds: Normal heart sounds. No murmur. No friction rub. No gallop.   Pulmonary:     Effort: Pulmonary effort is normal. No respiratory distress.     Breath sounds: Normal breath sounds. No stridor. No wheezing, rhonchi or rales.  Abdominal:     General: Bowel sounds are normal. There is no distension.     Palpations: Abdomen is soft. There is no mass.     Tenderness: There is no abdominal tenderness.  There is no right CVA tenderness, left CVA tenderness, guarding or rebound.  Skin:    General: Skin is warm and dry.     Coloration: Skin is not pale.     Findings: No rash.  Neurological:     General: No focal deficit present.     Mental Status: She is alert and oriented to person, place, and time.  Psychiatric:        Mood and Affect: Mood normal.        Behavior: Behavior normal.        Thought Content: Thought content normal.        Judgment: Judgment normal.     Results for orders placed or performed during the hospital encounter of 04/09/19 (from the past 24 hour(s))  POCT urinalysis dip (device)     Status: Abnormal   Collection Time: 04/09/19  7:20 PM  Result Value Ref Range   Glucose, UA NEGATIVE NEGATIVE mg/dL   Bilirubin Urine NEGATIVE NEGATIVE   Ketones, ur 15 (A) NEGATIVE mg/dL   Specific Gravity, Urine 1.025 1.005 - 1.030   Hgb urine dipstick LARGE (A) NEGATIVE   pH 5.5 5.0 - 8.0   Protein, ur 100 (A) NEGATIVE mg/dL   Urobilinogen, UA 0.2 0.0 - 1.0 mg/dL   Nitrite POSITIVE (A) NEGATIVE   Leukocytes,Ua SMALL (A) NEGATIVE  Pregnancy, urine POC     Status: Abnormal   Collection Time: 04/09/19  7:23 PM  Result Value Ref Range   Preg Test, Ur POSITIVE (A) NEGATIVE    Assessment and Plan :   1. Acute cystitis with hematuria   2. Pregnancy, unspecified gestational age   47. Positive urine pregnancy test     Patient has complicated UTI due to pregnancy.  No signs of pyelonephritis.  Will start patient on Macrobid, urine culture pending.  Patient was distressed about the pregnancy, reassured patient and recommended medications safe in pregnancy.  Also advised that she start prenatal vitamin in case she intends on keeping pregnancy, establish with OB within her insurance policy. Counseled patient on potential for adverse effects with medications prescribed/recommended today, ER and return-to-clinic precautions discussed, patient verbalized understanding.    Jaynee Eagles, PA-C 04/09/19 1945

## 2019-04-09 NOTE — ED Triage Notes (Signed)
PT has had kidney infections in the past. No UTI symptoms. Yesterday PT starting having a squeezing flank pain on right side that is worse with coughing and certain movements. Reminds her of previous infection. Tylenol helped yuesterday.

## 2019-04-11 LAB — URINE CULTURE: Culture: >=100000 — AB

## 2019-04-12 ENCOUNTER — Telehealth (HOSPITAL_COMMUNITY): Payer: Self-pay | Admitting: Emergency Medicine

## 2019-04-12 NOTE — Telephone Encounter (Signed)
Pt returned call, states she was able to pick up the antibiotic and is taking it, pt encouraged to follow up if still having symptoms after antibiotics are finished. Pt verbalized understanding, all questions answered.

## 2019-04-12 NOTE — Telephone Encounter (Signed)
Urine culture was positive for e coli and was given  macrobid at urgent care visit. Attempted to reach patient. No answer at this time.   

## 2019-05-13 DIAGNOSIS — F411 Generalized anxiety disorder: Secondary | ICD-10-CM | POA: Insufficient documentation

## 2019-05-13 DIAGNOSIS — Z3490 Encounter for supervision of normal pregnancy, unspecified, unspecified trimester: Secondary | ICD-10-CM | POA: Insufficient documentation

## 2019-05-13 LAB — OB RESULTS CONSOLE ABO/RH: RH Type: POSITIVE

## 2019-05-13 LAB — OB RESULTS CONSOLE GC/CHLAMYDIA
Chlamydia: NEGATIVE
Gonorrhea: NEGATIVE

## 2019-05-13 LAB — OB RESULTS CONSOLE HIV ANTIBODY (ROUTINE TESTING): HIV: NONREACTIVE

## 2019-05-13 LAB — OB RESULTS CONSOLE RPR: RPR: NONREACTIVE

## 2019-05-13 LAB — OB RESULTS CONSOLE HEPATITIS B SURFACE ANTIGEN: Hepatitis B Surface Ag: NEGATIVE

## 2019-05-13 LAB — OB RESULTS CONSOLE RUBELLA ANTIBODY, IGM: Rubella: IMMUNE

## 2019-05-13 LAB — OB RESULTS CONSOLE ANTIBODY SCREEN: Antibody Screen: NEGATIVE

## 2019-07-19 ENCOUNTER — Other Ambulatory Visit: Payer: Self-pay

## 2019-07-19 ENCOUNTER — Ambulatory Visit (HOSPITAL_COMMUNITY)
Admission: EM | Admit: 2019-07-19 | Discharge: 2019-07-19 | Disposition: A | Discharge status: Home / Self Care | Payer: BC Managed Care – PPO | Attending: Physician Assistant | Admitting: Physician Assistant

## 2019-07-19 ENCOUNTER — Encounter (HOSPITAL_COMMUNITY): Payer: Self-pay

## 2019-07-19 DIAGNOSIS — U071 COVID-19: Secondary | ICD-10-CM | POA: Insufficient documentation

## 2019-07-19 DIAGNOSIS — R0981 Nasal congestion: Secondary | ICD-10-CM | POA: Diagnosis present

## 2019-07-19 DIAGNOSIS — Z20822 Contact with and (suspected) exposure to covid-19: Secondary | ICD-10-CM

## 2019-07-19 DIAGNOSIS — H04209 Unspecified epiphora, unspecified lacrimal gland: Secondary | ICD-10-CM | POA: Insufficient documentation

## 2019-07-19 DIAGNOSIS — R067 Sneezing: Secondary | ICD-10-CM | POA: Diagnosis not present

## 2019-07-19 MED ORDER — FLUTICASONE PROPIONATE 50 MCG/ACT NA SUSP: 1.0000 | Freq: Every day | NASAL | 2 refills | Status: DC

## 2019-07-19 NOTE — Discharge Instructions (Signed)
Use the flonase daily once we know its not COVID  Use nasal saline drops daily as much as you like.  You may consider taking zyrtec if you would like.  Please follow up with your OB doctor if you want to further discuss safe medications during pregnancy for nasal symptoms.  If your Covid-19 test is positive, you will receive a phone call from [facility] regarding your results. Negative test results are not called. Both positive and negative results area always visible on MyChart. If you do not have a MyChart account, sign up instructions are in your discharge papers.   Persons who are directed to care for themselves at home may discontinue isolation under the following conditions:   At least 10 days have passed since symptom onset and  At least 24 hours have passed without running a fever (this means without the use of fever-reducing medications) and  Other symptoms have improved.  Persons infected with COVID-19 who never develop symptoms may discontinue isolation and other precautions 10 days after the date of their first positive COVID-19 test.

## 2019-07-19 NOTE — ED Provider Notes (Signed)
[facility]    CSN: [id number] Arrival date & time: 07/19/19  1336      History   Chief Complaint Chief Complaint  Patient presents with  . Nasal Congestion    HPI Alexis Hunt is a 24 y.o. female.   Patient reports to urgent care today for nasal congestin for the last 1 week. She reports clear nasal discharge, sneezing and watery eyes. She endorses a history of allergies and does not take medications for this. She does report mild "scratchy" throat this morning. Denies cough, headache, fever, chills, nausea, vomiting, diarrhea, change or loss of smell and taste.  Denies sick contacts  She has not taken medications for it.      Past Medical History:  Diagnosis Date  . Concussion     There are no problems to display for this patient.   History reviewed. No pertinent surgical history.  OB History    Gravida  1   Para      Term      Preterm      AB      Living        SAB      TAB      Ectopic      Multiple      Live Births               Home Medications    Prior to Admission medications   Medication Sig Start Date End Date Taking? Authorizing Provider  fluticasone (FLONASE) 50 MCG/ACT nasal spray Place 1 spray into both nostrils daily. 07/19/19 08/18/19  Duward Allbritton, Veryl Speak, PA-C  nitrofurantoin, macrocrystal-monohydrate, (MACROBID) 100 MG capsule Take 1 capsule (100 mg total) by mouth 2 (two) times daily. 04/09/19   Wallis Bamberg, PA-C  NON FORMULARY Antibiotic for uti    [provider]  pantoprazole (PROTONIX) 20 MG tablet Take 1 tablet (20 mg total) by mouth daily. 12/07/18 04/09/19  Bethann Berkshire, MD    Family History Family History  Problem Relation Age of Onset  . Healthy Mother   . Healthy Father   . Heart disease Maternal Grandfather   . Hyperlipidemia Maternal Grandfather   . Stroke Maternal Grandfather   . Hyperlipidemia Paternal Grandmother   . Hyperlipidemia Paternal Grandfather     Social History Social  History   Tobacco Use  . Smoking status: Current Every Day Smoker    Packs/day: 0.25    Types: Cigarettes  . Smokeless tobacco: Never Used  Substance Use Topics  . Alcohol use: No    Alcohol/week: 0.0 standard drinks  . Drug use: No     Allergies   Patient has no known allergies.   Review of Systems Review of Systems  Constitutional: Negative for chills, fatigue and fever.  HENT: Positive for congestion, postnasal drip, rhinorrhea and sneezing. Negative for ear pain, sinus pressure, sinus pain and sore throat.   Eyes: Negative for photophobia, pain, itching and visual disturbance.  Respiratory: Negative for cough and shortness of breath.   Cardiovascular: Negative for chest pain and palpitations.  Gastrointestinal: Negative for abdominal pain, diarrhea, nausea and vomiting.  Genitourinary: Negative for dysuria and hematuria.  Musculoskeletal: Negative for arthralgias, back pain and myalgias.  Skin: Negative for color change and rash.  Neurological: Negative for seizures, syncope and headaches.  All other systems reviewed and are negative.    Physical Exam Triage Vital Signs ED Triage Vitals  Enc Vitals Group     BP 07/19/19 1403 128/83  Pulse Rate 07/19/19 1403 78     Resp 07/19/19 1403 17     Temp 07/19/19 1403 98.7 F (37.1 C)     Temp Source 07/19/19 1403 Oral     SpO2 07/19/19 1403 100 %     Weight --      Height --      Head Circumference --      Peak Flow --      Pain Score 07/19/19 1404 0     Pain Loc --      Pain Edu? --      Excl. in [place]? --    No data found.  Updated Vital Signs BP 128/83 (BP Location: Left Arm)   Pulse 78   Temp 98.7 F (37.1 C) (Oral)   Resp 17   LMP 03/12/2019   SpO2 100%   Visual Acuity Right Eye Distance:   Left Eye Distance:   Bilateral Distance:    Right Eye Near:   Left Eye Near:    Bilateral Near:     Physical Exam Vitals and nursing note reviewed.  Constitutional:      General: She is not in acute  distress.    Appearance: Normal appearance. She is well-developed. She is not ill-appearing.  HENT:     Head: Normocephalic and atraumatic.     Right Ear: Tympanic membrane normal.     Left Ear: Tympanic membrane normal.     Nose: Congestion and rhinorrhea present.     Mouth/Throat:     Mouth: Mucous membranes are moist.     Pharynx: Oropharynx is clear.  Eyes:     Extraocular Movements: Extraocular movements intact.     Conjunctiva/sclera: Conjunctivae normal.     Pupils: Pupils are equal, round, and reactive to light.     Comments: watery  Cardiovascular:     Rate and Rhythm: Normal rate and regular rhythm.     Heart sounds: No murmur.  Pulmonary:     Effort: Pulmonary effort is normal. No respiratory distress.     Breath sounds: Normal breath sounds. No wheezing, rhonchi or rales.  Abdominal:     Palpations: Abdomen is soft.     Tenderness: There is no abdominal tenderness.  Musculoskeletal:     Cervical back: Neck supple.     Right lower leg: No edema.     Left lower leg: No edema.  Lymphadenopathy:     Cervical: No cervical adenopathy.  Skin:    General: Skin is warm and dry.  Neurological:     General: No focal deficit present.     Mental Status: She is alert and oriented to person, place, and time.  Psychiatric:        Mood and Affect: Mood normal.        Behavior: Behavior normal.        Thought Content: Thought content normal.        Judgment: Judgment normal.      UC Treatments / Results  Labs (all labs ordered are listed, but only abnormal results are displayed) Labs Reviewed  NOVEL CORONAVIRUS, NAA (HOSP ORDER, SEND-OUT TO REF LAB; TAT 18-24 HRS)    EKG   Radiology No results found.  Procedures Procedures (including critical care time)  Medications Ordered in UC Medications - No data to display  Initial Impression / Assessment and Plan / UC Course  I have reviewed the triage vital signs and the nursing notes.  Pertinent labs & imaging  results that were  available during my care of the patient were reviewed by me and considered in my medical decision making (see chart for details).     #nasal congestion #sneezing with watery eyes Likely this is related to allergic rhinitis , however COVID PCR is necessary to rule out infection.  - Flonase following COVID PCR results - Saline spray daily -Consider zyrtec   Final Clinical Impressions(s) / UC Diagnoses   Final diagnoses:  Encounter for laboratory testing for COVID-19 virus  Nasal congestion  Sneezing with watery eyes     Discharge Instructions     Use the flonase daily once we know its not COVID  Use nasal saline drops daily as much as you like.  You may consider taking zyrtec if you would like.  Please follow up with your OB doctor if you want to further discuss safe medications during pregnancy for nasal symptoms.  If your Covid-19 test is positive, you will receive a phone call from [facility] regarding your results. Negative test results are not called. Both positive and negative results area always visible on MyChart. If you do not have a MyChart account, sign up instructions are in your discharge papers.   Persons who are directed to care for themselves at home may discontinue isolation under the following conditions:  . At least 10 days have passed since symptom onset and . At least 24 hours have passed without running a fever (this means without the use of fever-reducing medications) and . Other symptoms have improved.  Persons infected with COVID-19 who never develop symptoms may discontinue isolation and other precautions 10 days after the date of their first positive COVID-19 test.      ED Prescriptions    Medication Sig Dispense Auth. Provider   fluticasone (FLONASE) 50 MCG/ACT nasal spray Place 1 spray into both nostrils daily. 11.1 mL Jnya Brossard, Veryl Speak, PA-C     PDMP not reviewed this encounter.   Hermelinda Medicus, PA-C 07/19/19 1911

## 2019-07-19 NOTE — ED Triage Notes (Signed)
Pt presents with nasal congestion X 1 week.  

## 2019-07-22 ENCOUNTER — Telehealth (HOSPITAL_COMMUNITY): Payer: Self-pay | Admitting: Emergency Medicine

## 2019-07-22 LAB — NOVEL CORONAVIRUS, NAA (HOSP ORDER, SEND-OUT TO REF LAB; TAT 18-24 HRS): SARS-CoV-2, NAA: DETECTED — AB

## 2019-07-22 NOTE — Telephone Encounter (Signed)

## 2019-10-05 ENCOUNTER — Encounter (HOSPITAL_COMMUNITY): Payer: Self-pay | Admitting: Obstetrics and Gynecology

## 2019-10-05 ENCOUNTER — Inpatient Hospital Stay (HOSPITAL_COMMUNITY)
Admission: AD | Admit: 2019-10-05 | Discharge: 2019-10-05 | Disposition: A | Discharge status: Home / Self Care | Payer: BC Managed Care – PPO | Attending: Obstetrics and Gynecology | Admitting: Obstetrics and Gynecology

## 2019-10-05 ENCOUNTER — Other Ambulatory Visit: Payer: Self-pay

## 2019-10-05 DIAGNOSIS — Z79899 Other long term (current) drug therapy: Secondary | ICD-10-CM | POA: Insufficient documentation

## 2019-10-05 DIAGNOSIS — Z3A30 30 weeks gestation of pregnancy: Secondary | ICD-10-CM | POA: Insufficient documentation

## 2019-10-05 DIAGNOSIS — O368130 Decreased fetal movements, third trimester, not applicable or unspecified: Secondary | ICD-10-CM | POA: Diagnosis present

## 2019-10-05 DIAGNOSIS — Z87891 Personal history of nicotine dependence: Secondary | ICD-10-CM | POA: Insufficient documentation

## 2019-10-05 LAB — URINALYSIS, ROUTINE W REFLEX MICROSCOPIC
Bilirubin Urine: NEGATIVE
Glucose, UA: NEGATIVE mg/dL
Ketones, ur: NEGATIVE mg/dL
Leukocytes,Ua: NEGATIVE
Nitrite: NEGATIVE
Protein, ur: NEGATIVE mg/dL
Specific Gravity, Urine: 1.004 — ABNORMAL LOW (ref 1.005–1.030)
pH: 7 (ref 5.0–8.0)

## 2019-10-05 NOTE — MAU Provider Note (Signed)
History     CSN: [id number]  Arrival date and time: 10/05/19 2128   First Provider Initiated Contact with Patient 10/05/19 2209      Chief Complaint  Patient presents with  . Decreased Fetal Movement   HPI  Alexis Hunt is a 24 yo G1P0 at [redacted] weeks EGA who is presenting to MAU with complaints of Decreased Fetal Movement. She reports that she noticed her baby was not moving regularly this morning. She tried drinking a cold soda and cold water, then doing fetal kick counts. Normally she can feel her baby move throughout the day. She notes that usually her baby has large movements and kicks, and that if she hasn't felt her baby move for a while, if she nudges her belly she can induce movement.  Denies leaking of fluid, vaginal bleeding, or contractions.  OB History    Gravida  1   Para      Term      Preterm      AB      Living        SAB      TAB      Ectopic      Multiple      Live Births              Past Medical History:  Diagnosis Date  . Concussion     Past Surgical History:  Procedure Laterality Date  . NO PAST SURGERIES      Family History  Problem Relation Age of Onset  . Healthy Mother   . Healthy Father   . Heart disease Maternal Grandfather   . Hyperlipidemia Maternal Grandfather   . Stroke Maternal Grandfather   . Hyperlipidemia Paternal Grandmother   . Hyperlipidemia Paternal Grandfather     Social History   Tobacco Use  . Smoking status: Former Smoker    Packs/day: 0.25    Types: Cigarettes    Quit date: 10/02/2019  . Smokeless tobacco: Never Used  Substance Use Topics  . Alcohol use: No    Alcohol/week: 0.0 standard drinks  . Drug use: No    Allergies: No Known Allergies  Medications Prior to Admission  Medication Sig Dispense Refill Last Dose  . Prenatal Vit-Fe Fumarate-FA (MULTIVITAMIN-PRENATAL) 27-0.8 MG TABS tablet Take 1 tablet by mouth daily at 12 noon.   10/05/2019 at Unknown time  . fluticasone (FLONASE) 50 MCG/ACT  nasal spray Place 1 spray into both nostrils daily. 11.1 mL 2   . nitrofurantoin, macrocrystal-monohydrate, (MACROBID) 100 MG capsule Take 1 capsule (100 mg total) by mouth 2 (two) times daily. 20 capsule 0   . NON FORMULARY Antibiotic for uti       Review of Systems  All other systems reviewed and are negative.  Physical Exam   Blood pressure (!) 148/85, pulse 100, resp. rate 17, last menstrual period 03/12/2019.  Physical Exam  Nursing note and vitals reviewed. Constitutional: She is oriented to person, place, and time. She appears well-developed and well-nourished.  HENT:  Head: Normocephalic and atraumatic.  Eyes: Pupils are equal, round, and reactive to light. Conjunctivae and EOM are normal.  Cardiovascular: Normal rate, regular rhythm, normal heart sounds and intact distal pulses.  Respiratory: Effort normal and breath sounds normal.  GI: Soft. Bowel sounds are normal. She exhibits no distension and no mass. There is no abdominal tenderness. There is no rebound and no guarding.  gravid  Musculoskeletal:        General: Normal range of motion.  Cervical back: Normal range of motion and neck supple.  Neurological: She is alert and oriented to person, place, and time. She has normal reflexes.  Skin: Skin is warm and dry.  Psychiatric: She has a normal mood and affect. Her behavior is normal. Judgment and thought content normal.    MAU Course  Procedures  MDM:  Bedside US Performed Pt informed that the ultrasound is considered a limited OB ultrasound and is not intended to be a complete ultrasound exam.  Patient also informed that the ultrasound is not being completed with the intent of assessing for fetal or placental anomalies or any pelvic abnormalities.  Explained that the purpose of today's ultrasound is to assess for  assess for fetal movements.  Patient acknowledges the purpose of the exam and the limitations of the study.   - Movements of hands and feet seen on  [place] - After [place], patient reports feeling a big kick  NST -baseline: 145 -variability: moderate -accels: 15x15 -decels: none -interpretation: reactive  -Clicker given to patient -Soda and crackers  RECHECK 2240: -Patient has felt baby move several times since she had a snack  Assessment and Plan  24 yo G1P0 at [redacted] weeks EGA who is presenting to MAU with complaints of Decreased Fetal Movement -Movement seen on BSUS -Educated on Fetal Kick Counts -NST reactive -DC to home in stable condition -Return precautions given -Follow up with OB as scheduled  Kadelyn Dimascio L Candace Ramus 10/05/2019, 10:17 PM

## 2019-10-05 NOTE — MAU Note (Signed)
Pt reports to MAU c/o DFM that started today. Pt denies any other complications. Pt states yesterday at the OB she had to have a [place] because the FHR was "skipping." pt state per [place] everything looked normal. Pt has not felt baby move since this AM. Pt has tried cold water position change ect. FHR 158.

## 2019-10-05 NOTE — Discharge Instructions (Signed)
Fetal Movement Counts Patient Name: ________________________________________________ Patient Due Date: ____________________ What is a fetal movement count?  A fetal movement count is the number of times that you feel your baby move during a certain amount of time. This may also be called a fetal kick count. A fetal movement count is recommended for every pregnant woman. You may be asked to start counting fetal movements as early as week 28 of your pregnancy. Pay attention to when your baby is most active. You may notice your baby's sleep and wake cycles. You may also notice things that make your baby move more. You should do a fetal movement count:  When your baby is normally most active.  At the same time each day. A good time to count movements is while you are resting, after having something to eat and drink. How do I count fetal movements? 1. Find a quiet, comfortable area. Sit, or lie down on your side. 2. Write down the date, the start time and stop time, and the number of movements that you felt between those two times. Take this information with you to your health care visits. 3. Write down your start time when you feel the first movement. 4. Count kicks, flutters, swishes, rolls, and jabs. You should feel at least 10 movements. 5. You may stop counting after you have felt 10 movements, or if you have been counting for 2 hours. Write down the stop time. 6. If you do not feel 10 movements in 2 hours, contact your health care provider for further instructions. Your health care provider may want to do additional tests to assess your baby's well-being. Contact a health care provider if:  You feel fewer than 10 movements in 2 hours.  Your baby is not moving like he or she usually does. Date: ____________ Start time: ____________ Stop time: ____________ Movements: ____________ Date: ____________ Start time: ____________ Stop time: ____________ Movements: ____________ Date: ____________  Start time: ____________ Stop time: ____________ Movements: ____________ Date: ____________ Start time: ____________ Stop time: ____________ Movements: ____________ Date: ____________ Start time: ____________ Stop time: ____________ Movements: ____________ Date: ____________ Start time: ____________ Stop time: ____________ Movements: ____________ Date: ____________ Start time: ____________ Stop time: ____________ Movements: ____________ Date: ____________ Start time: ____________ Stop time: ____________ Movements: ____________ Date: ____________ Start time: ____________ Stop time: ____________ Movements: ____________ This information is not intended to replace advice given to you by your health care provider. Make sure you discuss any questions you have with your health care provider. Document Revised: 01/10/2019 Document Reviewed: 01/10/2019 Elsevier Patient Education  2020 [employer]  

## 2019-11-20 LAB — OB RESULTS CONSOLE GBS: GBS: NEGATIVE

## 2019-11-25 ENCOUNTER — Other Ambulatory Visit: Payer: Self-pay

## 2019-11-25 ENCOUNTER — Inpatient Hospital Stay (HOSPITAL_COMMUNITY)
Admission: AD | Admit: 2019-11-25 | Discharge: 2019-11-25 | Disposition: A | Discharge status: Home / Self Care | Payer: BC Managed Care – PPO | Attending: Obstetrics and Gynecology | Admitting: Obstetrics and Gynecology

## 2019-11-25 ENCOUNTER — Encounter (HOSPITAL_COMMUNITY): Payer: Self-pay | Admitting: Obstetrics and Gynecology

## 2019-11-25 DIAGNOSIS — O99343 Other mental disorders complicating pregnancy, third trimester: Secondary | ICD-10-CM | POA: Insufficient documentation

## 2019-11-25 DIAGNOSIS — Z8249 Family history of ischemic heart disease and other diseases of the circulatory system: Secondary | ICD-10-CM | POA: Insufficient documentation

## 2019-11-25 DIAGNOSIS — O139 Gestational [pregnancy-induced] hypertension without significant proteinuria, unspecified trimester: Secondary | ICD-10-CM

## 2019-11-25 DIAGNOSIS — Z3A37 37 weeks gestation of pregnancy: Secondary | ICD-10-CM

## 2019-11-25 DIAGNOSIS — O133 Gestational [pregnancy-induced] hypertension without significant proteinuria, third trimester: Secondary | ICD-10-CM | POA: Diagnosis present

## 2019-11-25 DIAGNOSIS — F419 Anxiety disorder, unspecified: Secondary | ICD-10-CM | POA: Insufficient documentation

## 2019-11-25 DIAGNOSIS — O368330 Maternal care for abnormalities of the fetal heart rate or rhythm, third trimester, not applicable or unspecified: Secondary | ICD-10-CM | POA: Diagnosis not present

## 2019-11-25 DIAGNOSIS — Z87891 Personal history of nicotine dependence: Secondary | ICD-10-CM | POA: Insufficient documentation

## 2019-11-25 DIAGNOSIS — R55 Syncope and collapse: Secondary | ICD-10-CM

## 2019-11-25 LAB — COMPREHENSIVE METABOLIC PANEL
ALT: 13 U/L (ref 0–44)
AST: 22 U/L (ref 15–41)
Albumin: 2.9 g/dL — ABNORMAL LOW (ref 3.5–5.0)
Alkaline Phosphatase: 116 U/L (ref 38–126)
Anion gap: 11 (ref 5–15)
BUN: <5 mg/dL — ABNORMAL LOW (ref 6–20)
CO2: 20 mmol/L — ABNORMAL LOW (ref 22–32)
Calcium: 8.8 mg/dL — ABNORMAL LOW (ref 8.9–10.3)
Chloride: 107 mmol/L (ref 98–111)
Creatinine, Ser: 0.47 mg/dL (ref 0.44–1.00)
GFR calc Af Amer: >60 mL/min (ref >60)
GFR calc non Af Amer: >60 mL/min (ref >60)
Glucose, Bld: 118 mg/dL — ABNORMAL HIGH (ref 70–99)
Potassium: 3.1 mmol/L — ABNORMAL LOW (ref 3.5–5.1)
Sodium: 138 mmol/L (ref 135–145)
Total Bilirubin: 0.6 mg/dL (ref 0.3–1.2)
Total Protein: 6 g/dL — ABNORMAL LOW (ref 6.5–8.1)

## 2019-11-25 LAB — URINALYSIS, ROUTINE W REFLEX MICROSCOPIC
Bilirubin Urine: NEGATIVE
Glucose, UA: NEGATIVE mg/dL
Ketones, ur: NEGATIVE mg/dL
Leukocytes,Ua: NEGATIVE
Nitrite: NEGATIVE
Protein, ur: NEGATIVE mg/dL
Specific Gravity, Urine: 1.004 — ABNORMAL LOW (ref 1.005–1.030)
pH: 7 (ref 5.0–8.0)

## 2019-11-25 LAB — CBC
HCT: 34.8 % — ABNORMAL LOW (ref 36.0–46.0)
Hemoglobin: 11.6 g/dL — ABNORMAL LOW (ref 12.0–15.0)
MCH: 31 pg (ref 26.0–34.0)
MCHC: 33.3 g/dL (ref 30.0–36.0)
MCV: 93 fL (ref 80.0–100.0)
Platelets: 166 10*3/uL (ref 150–400)
RBC: 3.74 MIL/uL — ABNORMAL LOW (ref 3.87–5.11)
RDW: 13.7 % (ref 11.5–15.5)
WBC: 9 10*3/uL (ref 4.0–10.5)
nRBC: 0 % (ref 0.0–0.2)

## 2019-11-25 LAB — PROTEIN / CREATININE RATIO, URINE
Creatinine, Urine: 22.93 mg/dL
Total Protein, Urine: <6 mg/dL

## 2019-11-25 MED ORDER — PROMETHAZINE HCL 25 MG/ML IJ SOLN
25.0000 mg | Freq: Once | INTRAMUSCULAR | Status: AC
  Administered 2019-11-25: 25 mg via INTRAVENOUS
  Filled 2019-11-25: qty 1

## 2019-11-25 MED ORDER — LACTATED RINGERS IV BOLUS
500.0000 mL | Freq: Once | INTRAVENOUS | Status: AC
  Administered 2019-11-25: 500 mL via INTRAVENOUS

## 2019-11-25 NOTE — MAU Note (Signed)
Alexis Hunt is a 24 y.o. at [redacted]w[redacted]d here in MAU reporting: increased swelling in hands and feet since Saturday. BP on Saturday at home was 147/93. Checked several times on Sunday and they were 119/57, 142/84, and 149/94. Today BP was 160/87 and 175/93. Called office and they told her to come in. Denies headache, blurry vision, or RUQ pain. No vaginal bleeding, LOF, or abdominal pain. +FM  Onset of complaint: Saturday  Pain score: 0/10  Vitals:   11/25/19 1657  BP: (!) 136/98  Pulse: (!) 146  Resp: 16  Temp: 98.3 F (36.8 C)  SpO2: 97%     FHT: +FM  Lab orders placed from triage: UA

## 2019-11-25 NOTE — Discharge Instructions (Signed)

## 2019-11-25 NOTE — MAU Provider Note (Signed)
History     CSN: [id number]  Arrival date and time: 11/25/19 9371   First Provider Initiated Contact with Patient 11/25/19 1704      Chief Complaint  Patient presents with  . Hypertension   HPI Alexis Hunt is a 24 y.o. G1P0 at [redacted]w[redacted]d who presents to MAU for evaluation of new onset elevated blood pressures. She denies headache, visua disturbances, RUQ/epigastric pain, new onset swelling or weight gain.  Patient endorses history of significant anxiety triggered by medical appointments as well as syncopal episodes during IV starts and serum lab collection  She denies vaginal bleeding, leaking of fluid, decreased fetal movement, fever, falls, or recent illness.   She receives care with [facility] and her next appointment is Wednesday 11/27/2019.  OB History    Gravida  1   Para      Term      Preterm      AB      Living        SAB      TAB      Ectopic      Multiple      Live Births              Past Medical History:  Diagnosis Date  . Concussion     Past Surgical History:  Procedure Laterality Date  . NO PAST SURGERIES      Family History  Problem Relation Age of Onset  . Healthy Mother   . Healthy Father   . Heart disease Maternal Grandfather   . Hyperlipidemia Maternal Grandfather   . Stroke Maternal Grandfather   . Hyperlipidemia Paternal Grandmother   . Hyperlipidemia Paternal Grandfather     Social History   Tobacco Use  . Smoking status: Former Smoker    Packs/day: 0.25    Types: Cigarettes    Quit date: 10/02/2019    Years since quitting: 0.1  . Smokeless tobacco: Never Used  Vaping Use  . Vaping Use: Never used  Substance Use Topics  . Alcohol use: No    Alcohol/week: 0.0 standard drinks  . Drug use: No    Allergies: No Known Allergies  Medications Prior to Admission  Medication Sig Dispense Refill Last Dose  . fluticasone (FLONASE) 50 MCG/ACT nasal spray Place 1 spray into both nostrils daily. 11.1 mL 2   .  Prenatal Vit-Fe Fumarate-FA (MULTIVITAMIN-PRENATAL) 27-0.8 MG TABS tablet Take 1 tablet by mouth daily at 12 noon.       Review of Systems  Eyes: Negative for photophobia and visual disturbance.  Respiratory: Negative for shortness of breath.   Cardiovascular: Negative for palpitations.  Gastrointestinal: Negative for abdominal pain.  Genitourinary: Negative for vaginal bleeding.  Musculoskeletal: Negative for back pain.  Neurological: Negative for headaches.  All other systems reviewed and are negative.  Physical Exam   Blood pressure (!) 108/58, pulse (!) 109, temperature 98.3 F (36.8 C), temperature source Oral, resp. rate 16, last menstrual period 03/12/2019, SpO2 97 %.  Physical Exam  Nursing note and vitals reviewed. Cardiovascular: Normal heart sounds and normal pulses. Tachycardia present.  Respiratory: Effort normal and breath sounds normal.  GI: Normal appearance.  Gravid  Neurological: She is alert.  Skin: Capillary refill takes less than 2 seconds.  Psychiatric: Her behavior is normal. Mood normal.    MAU Course/MDM  Procedures   --Elevated BP in MAU 10/2019. --Patient very anxious, visibly sweating on arrival and initial assessment. Does not medicate episodes of anxiety --Maternal and fetal  tachycardia. Fetal tachycardia resolved with 1 liter IV fluid bolus. Maternal tachycardia improving following initial assessment. --Reactive tracing: baseline 155 following fluid bolus, mod variability, positive 15 x 15 accels, no decels --Toco: Irregular mild contractions q 2-6 min --Workup discussed with both Dr. Debroah Loop and Dr. Mindi Slicker. Per Dr. Mindi Slicker, discharge home with strict precautions. Keep appointment on Wednesday with understanding that patient may be direct admission for IOL if BP is elevated at appointment  Orders Placed This Encounter  Procedures  . Urinalysis, Routine w reflex microscopic  . CBC  . Comprehensive metabolic panel  . Protein / creatinine ratio,  urine  . Measure blood pressure   Patient Vitals for the past 24 hrs:  BP Temp Temp src Pulse Resp SpO2  11/25/19 1846 117/60 -- -- (!) 101 -- --  11/25/19 1831 (!) 105/59 -- -- (!) 103 -- --  11/25/19 1816 (!) 108/58 -- -- (!) 109 -- --  11/25/19 1746 126/74 -- -- (!) 107 -- --  11/25/19 1731 132/76 -- -- (!) 121 -- --  11/25/19 1701 (!) 152/94 -- -- (!) 134 -- --  11/25/19 1657 (!) 136/98 98.3 F (36.8 C) Oral (!) 146 16 97 %   Results for orders placed or performed during the hospital encounter of 11/25/19 (from the past 24 hour(s))  Protein / creatinine ratio, urine     Status: None   Collection Time: 11/25/19  4:41 PM  Result Value Ref Range   Creatinine, Urine 22.93 mg/dL   Total Protein, Urine <6 mg/dL   Protein Creatinine Ratio        0.00 - 0.15 mg/mg[Cre]  Urinalysis, Routine w reflex microscopic     Status: Abnormal   Collection Time: 11/25/19  4:59 PM  Result Value Ref Range   Color, Urine YELLOW YELLOW   APPearance HAZY (A) CLEAR   Specific Gravity, Urine 1.004 (L) 1.005 - 1.030   pH 7.0 5.0 - 8.0   Glucose, UA NEGATIVE NEGATIVE mg/dL   Hgb urine dipstick SMALL (A) NEGATIVE   Bilirubin Urine NEGATIVE NEGATIVE   Ketones, ur NEGATIVE NEGATIVE mg/dL   Protein, ur NEGATIVE NEGATIVE mg/dL   Nitrite NEGATIVE NEGATIVE   Leukocytes,Ua NEGATIVE NEGATIVE   RBC / HPF 6-10 0 - 5 RBC/hpf   WBC, UA 0-5 0 - 5 WBC/hpf   Bacteria, UA FEW (A) NONE SEEN   Squamous Epithelial / LPF 11-20 0 - 5  CBC     Status: Abnormal   Collection Time: 11/25/19  5:18 PM  Result Value Ref Range   WBC 9.0 4.0 - 10.5 K/uL   RBC 3.74 (L) 3.87 - 5.11 MIL/uL   Hemoglobin 11.6 (L) 12.0 - 15.0 g/dL   HCT 93.7 (L) 36 - 46 %   MCV 93.0 80.0 - 100.0 fL   MCH 31.0 26.0 - 34.0 pg   MCHC 33.3 30.0 - 36.0 g/dL   RDW 90.2 40.9 - 73.5 %   Platelets 166 150 - 400 K/uL   nRBC 0.0 0.0 - 0.2 %  Comprehensive metabolic panel     Status: Abnormal   Collection Time: 11/25/19  5:18 PM  Result Value Ref Range    Sodium 138 135 - 145 mmol/L   Potassium 3.1 (L) 3.5 - 5.1 mmol/L   Chloride 107 98 - 111 mmol/L   CO2 20 (L) 22 - 32 mmol/L   Glucose, Bld 118 (H) 70 - 99 mg/dL   BUN <5 (L) 6 - 20 mg/dL   Creatinine, Ser  0.47 0.44 - 1.00 mg/dL   Calcium 8.8 (L) 8.9 - 10.3 mg/dL   Total Protein 6.0 (L) 6.5 - 8.1 g/dL   Albumin 2.9 (L) 3.5 - 5.0 g/dL   AST 22 15 - 41 U/L   ALT 13 0 - 44 U/L   Alkaline Phosphatase 116 38 - 126 U/L   Total Bilirubin 0.6 0.3 - 1.2 mg/dL   GFR calc non Af Amer >60 >60 mL/min   GFR calc Af Amer >60 >60 mL/min   Anion gap 11 5 - 15   Assessment and Plan  --24 y.o. G1P0 at [redacted]w[redacted]d  --Fetal tachycardia resolved --Gestational Hypertension, normal PEC labs --No severe range BPs or severe features --S/p consult with Dr. Mindi Slicker --Discharge home in stable condition  F/U: --Next appt with Dr. Jackelyn Knife 11/27/2019  Calvert Cantor, CNM 11/25/2019, 7:34 PM

## 2019-11-27 ENCOUNTER — Encounter (HOSPITAL_COMMUNITY): Payer: Self-pay | Admitting: *Deleted

## 2019-11-27 ENCOUNTER — Telehealth (HOSPITAL_COMMUNITY): Payer: Self-pay | Admitting: *Deleted

## 2019-11-27 NOTE — Telephone Encounter (Signed)
Preadmission screen  

## 2019-11-30 ENCOUNTER — Other Ambulatory Visit (HOSPITAL_COMMUNITY)
Admission: RE | Admit: 2019-11-30 | Discharge: 2019-11-30 | Disposition: A | Discharge status: Home / Self Care | Payer: BC Managed Care – PPO | Source: Ambulatory Visit | Attending: Obstetrics and Gynecology | Admitting: Obstetrics and Gynecology

## 2019-11-30 DIAGNOSIS — Z01812 Encounter for preprocedural laboratory examination: Secondary | ICD-10-CM | POA: Insufficient documentation

## 2019-11-30 DIAGNOSIS — Z20822 Contact with and (suspected) exposure to covid-19: Secondary | ICD-10-CM | POA: Insufficient documentation

## 2019-11-30 LAB — SARS CORONAVIRUS 2 (TAT 6-24 HRS): SARS Coronavirus 2: NEGATIVE

## 2019-12-01 ENCOUNTER — Other Ambulatory Visit: Payer: Self-pay | Admitting: Obstetrics and Gynecology

## 2019-12-02 ENCOUNTER — Inpatient Hospital Stay (HOSPITAL_COMMUNITY): Payer: BC Managed Care – PPO

## 2019-12-02 ENCOUNTER — Inpatient Hospital Stay (HOSPITAL_COMMUNITY)
Admission: AD | Admit: 2019-12-02 | Discharge: 2019-12-02 | DRG: 833 | Disposition: A | Discharge status: Home / Self Care | Payer: BC Managed Care – PPO | Attending: Obstetrics and Gynecology | Admitting: Obstetrics and Gynecology

## 2019-12-02 ENCOUNTER — Encounter (HOSPITAL_COMMUNITY): Payer: Self-pay | Admitting: Obstetrics and Gynecology

## 2019-12-02 ENCOUNTER — Other Ambulatory Visit: Payer: Self-pay

## 2019-12-02 DIAGNOSIS — O99333 Smoking (tobacco) complicating pregnancy, third trimester: Secondary | ICD-10-CM | POA: Diagnosis present

## 2019-12-02 DIAGNOSIS — O99283 Endocrine, nutritional and metabolic diseases complicating pregnancy, third trimester: Secondary | ICD-10-CM | POA: Diagnosis present

## 2019-12-02 DIAGNOSIS — Z20822 Contact with and (suspected) exposure to covid-19: Secondary | ICD-10-CM | POA: Diagnosis present

## 2019-12-02 DIAGNOSIS — Z3A38 38 weeks gestation of pregnancy: Secondary | ICD-10-CM

## 2019-12-02 DIAGNOSIS — O133 Gestational [pregnancy-induced] hypertension without significant proteinuria, third trimester: Secondary | ICD-10-CM | POA: Diagnosis present

## 2019-12-02 DIAGNOSIS — E876 Hypokalemia: Secondary | ICD-10-CM | POA: Diagnosis present

## 2019-12-02 DIAGNOSIS — F17210 Nicotine dependence, cigarettes, uncomplicated: Secondary | ICD-10-CM | POA: Diagnosis present

## 2019-12-02 HISTORY — DX: Anxiety disorder, unspecified: F41.9

## 2019-12-02 LAB — CBC
HCT: 34.6 % — ABNORMAL LOW (ref 36.0–46.0)
Hemoglobin: 11.8 g/dL — ABNORMAL LOW (ref 12.0–15.0)
MCH: 31.8 pg (ref 26.0–34.0)
MCHC: 34.1 g/dL (ref 30.0–36.0)
MCV: 93.3 fL (ref 80.0–100.0)
Platelets: 160 10*3/uL (ref 150–400)
RBC: 3.71 MIL/uL — ABNORMAL LOW (ref 3.87–5.11)
RDW: 13.7 % (ref 11.5–15.5)
WBC: 7.8 10*3/uL (ref 4.0–10.5)
nRBC: 0 % (ref 0.0–0.2)

## 2019-12-02 LAB — COMPREHENSIVE METABOLIC PANEL
ALT: 12 U/L (ref 0–44)
AST: 21 U/L (ref 15–41)
Albumin: 3 g/dL — ABNORMAL LOW (ref 3.5–5.0)
Alkaline Phosphatase: 116 U/L (ref 38–126)
Anion gap: 11 (ref 5–15)
BUN: 6 mg/dL (ref 6–20)
CO2: 20 mmol/L — ABNORMAL LOW (ref 22–32)
Calcium: 8.8 mg/dL — ABNORMAL LOW (ref 8.9–10.3)
Chloride: 106 mmol/L (ref 98–111)
Creatinine, Ser: 0.41 mg/dL — ABNORMAL LOW (ref 0.44–1.00)
GFR calc Af Amer: >60 mL/min (ref >60)
GFR calc non Af Amer: >60 mL/min (ref >60)
Glucose, Bld: 108 mg/dL — ABNORMAL HIGH (ref 70–99)
Potassium: 3.1 mmol/L — ABNORMAL LOW (ref 3.5–5.1)
Sodium: 137 mmol/L (ref 135–145)
Total Bilirubin: 0.7 mg/dL (ref 0.3–1.2)
Total Protein: 6.1 g/dL — ABNORMAL LOW (ref 6.5–8.1)

## 2019-12-02 LAB — ABO/RH: ABO/RH(D): O POS

## 2019-12-02 LAB — TYPE AND SCREEN
ABO/RH(D): O POS
Antibody Screen: NEGATIVE

## 2019-12-02 LAB — RPR: RPR Ser Ql: NONREACTIVE

## 2019-12-02 MED ORDER — OXYTOCIN BOLUS FROM INFUSION: 333.0000 mL | Freq: Once | INTRAVENOUS | Status: DC

## 2019-12-02 MED ORDER — TERBUTALINE SULFATE 1 MG/ML IJ SOLN: 0.2500 mg | Freq: Once | INTRAMUSCULAR | Status: DC | PRN

## 2019-12-02 MED ORDER — LACTATED RINGERS IV SOLN: 500.0000 mL | INTRAVENOUS | Status: DC | PRN

## 2019-12-02 MED ORDER — LACTATED RINGERS IV SOLN: INTRAVENOUS | Status: DC

## 2019-12-02 MED ORDER — OXYTOCIN-SODIUM CHLORIDE 30-0.9 UT/500ML-% IV SOLN: 2.5000 [IU]/h | INTRAVENOUS | Status: DC

## 2019-12-02 MED ORDER — SOD CITRATE-CITRIC ACID 500-334 MG/5ML PO SOLN: 30.0000 mL | ORAL | Status: DC | PRN

## 2019-12-02 MED ORDER — BUTORPHANOL TARTRATE 1 MG/ML IJ SOLN: 1.0000 mg | INTRAMUSCULAR | Status: DC | PRN

## 2019-12-02 MED ORDER — ACETAMINOPHEN 325 MG PO TABS: 650.0000 mg | ORAL_TABLET | ORAL | Status: DC | PRN

## 2019-12-02 MED ORDER — ONDANSETRON HCL 4 MG/2ML IJ SOLN: 4.0000 mg | Freq: Four times a day (QID) | INTRAMUSCULAR | Status: DC | PRN

## 2019-12-02 MED ORDER — MISOPROSTOL 25 MCG QUARTER TABLET
25.0000 ug | ORAL_TABLET | ORAL | Status: DC | PRN
  Administered 2019-12-02 (×2): 25 ug via VAGINAL
  Filled 2019-12-02 (×2): qty 1

## 2019-12-02 MED ORDER — OXYCODONE-ACETAMINOPHEN 5-325 MG PO TABS: 1.0000 | ORAL_TABLET | ORAL | Status: DC | PRN

## 2019-12-02 MED ORDER — OXYCODONE-ACETAMINOPHEN 5-325 MG PO TABS: 2.0000 | ORAL_TABLET | ORAL | Status: DC | PRN

## 2019-12-02 MED ORDER — LIDOCAINE HCL (PF) 1 % IJ SOLN: 30.0000 mL | INTRAMUSCULAR | Status: DC | PRN

## 2019-12-02 NOTE — Discharge Instructions (Signed)
Call for PIH sx, regular ctx, leaking fluid, or vaginal bleeding  Gave patient information packets on "Hypertension during pregnancy" and "Signs and symptoms of labor"  Signed, Enis Slipper, RN

## 2019-12-02 NOTE — Discharge Summary (Signed)
Physician Discharge Summary  Patient ID: Alexis Hunt MRN: [id number] DOB/AGE: [birth year] 24 y.o.  Admit date: 12/02/2019 Discharge date: 12/02/2019  Admission Diagnoses:  IUP at 38+ weeks, possible PIH  Discharge Diagnoses: IUP at 38+ weeks, normal BP Active Problems:   PIH (pregnancy induced hypertension), third trimester   Discharged Condition: good  Hospital Course: Pt was admitted for cervical ripening for PIH.  She received 2 doses of cytotec with minla cervical change.  All of her BP were normal, PIH labs normal.  Decision made to not continue to pursue induction since not 39 weeks   Discharge Exam: Blood pressure 129/79, pulse 80, temperature 98.8 F (37.1 C), temperature source Oral, resp. rate 18, height 5\' 1"  (1.549 m), weight 78.4 kg, last menstrual period 03/12/2019. General appearance: alert  Disposition: Discharge disposition: 01-Home or Self Care       Discharge Instructions    Discharge activity:  No Restrictions   Complete by: As directed      Allergies as of 12/02/2019   No Known Allergies     Medication List    TAKE these medications   fluticasone 50 MCG/ACT nasal spray Commonly known as: FLONASE Place 1 spray into both nostrils daily.   multivitamin-prenatal 27-0.8 MG Tabs tablet Take 1 tablet by mouth daily at 12 noon.       Follow-up Information    Bryndan Bilyk, MD Follow up in 2 day(s).   Specialty: Obstetrics and Gynecology Contact information: [address], [address] [place] [phone]               Signed: [phone] Don Tiu 12/02/2019, 4:40 PM

## 2019-12-02 NOTE — H&P (Signed)
Alexis Hunt is a 24 y.o. female, G1P0, EGA 38+ weeks with EDC 7-10 presenting for cervical ripening and induction for PIH.  She was seen in [facility] 6-21, had some slightly elevated BP up to 152/92, normal PIH labs, no proteinuria.  In the office on 6-23, BP 140/80s, no PIH sx.  At that point I felt she had developing PIH but nothing that required urgent delivery, and her cervix was closed.  I set her up to come in last night for cervical ripening.  She still has no PIH sx, BP all normal here.  PNC complicated by anxiety which may be contributing to intermittent elevated BP.  OB History    Gravida  1   Para  0   Term  0   Preterm  0   AB  0   Living  0     SAB  0   TAB  0   Ectopic  0   Multiple  0   Live Births  0          Past Medical History:  Diagnosis Date  . Anxiety   . Concussion   . Family history of adverse reaction to anesthesia    dad possible seizures  . Pregnancy induced hypertension    Past Surgical History:  Procedure Laterality Date  . NO PAST SURGERIES     Family History: family history includes Healthy in her father and mother; Heart disease in her maternal grandfather; Hyperlipidemia in her maternal grandfather, paternal grandfather, and paternal grandmother; Hypertension in her mother and paternal grandmother; Stroke in her maternal grandfather. Social History:  reports that she quit smoking about 2 months ago. Her smoking use included cigarettes. She smoked 0.25 packs per day. She has never used smokeless tobacco. She reports that she does not drink alcohol and does not use drugs.     Review of Systems  Respiratory: Negative.   Cardiovascular: Negative.    Maternal Medical History:  Contractions: Perceived severity is mild.    Fetal activity: Perceived fetal activity is normal.    Prenatal Complications - Diabetes: none.    Dilation: Fingertip Effacement (%): Thick Station: -3 Exam by:: J Mbugua RN Blood pressure 112/69, pulse (!)  101, temperature 98.4 F (36.9 C), temperature source Oral, resp. rate 16, height 5\' 1"  (1.549 m), weight 78.4 kg, last menstrual period 03/12/2019. Maternal Exam:  Uterine Assessment: Contraction strength is mild.  Contraction frequency is rare.   Abdomen: Patient reports no abdominal tenderness. Estimated fetal weight is 7 lbs.   Fetal presentation: vertex  Introitus: Normal vulva. Normal vagina.  Amniotic fluid character: not assessed.  Pelvis: adequate for delivery.      Fetal Exam Fetal Monitor Review: Mode: ultrasound.   Baseline rate: 130-140.  Variability: moderate (6-25 bpm).   Pattern: accelerations present and no decelerations.    Fetal State Assessment: Category I - tracings are normal.     Physical Exam Vitals reviewed.  Constitutional:      Appearance: Normal appearance.  Cardiovascular:     Rate and Rhythm: Normal rate and regular rhythm.  Pulmonary:     Effort: Pulmonary effort is normal. No respiratory distress.  Abdominal:     Palpations: Abdomen is soft.  Genitourinary:    General: Normal vulva.  Neurological:     Mental Status: She is alert.     Prenatal labs: ABO, Rh: --/--/O POS, O POS Performed at [facility], [place] [address], [place], [place] [address]  [phone] 0015)  Antibody: NEG (06/28 0015) Rubella: Immune (12/07 0000) RPR: Nonreactive (12/07 0000)  HBsAg: Negative (12/07 0000)  HIV: Non-reactive (12/07 0000)  GBS: Negative/-- (06/16 0000)   Assessment/Plan: IUP at 38+ weeks with suspected PIH, admitted overnight for cervical ripening.  She has received 2 doses of cytotec with minimal change in cervix.  No PIH sx, all BP here are normal, normal labs except for mild hypokalemia.  I discussed that for now she does not have PIH that requires delivery, and since she is not 39 weeks she has no other indication to continue to pursue delivery at this time.  Will give her until 4 hours from her second cytotec, and as long as BP remains  normal and ROM intact and not in labor will d/c home and f/u in the office in 2 days   Zenaida Niece 12/02/2019, 7:45 AM

## 2019-12-05 ENCOUNTER — Other Ambulatory Visit (HOSPITAL_COMMUNITY)
Admission: RE | Admit: 2019-12-05 | Discharge: 2019-12-05 | Disposition: A | Discharge status: Home / Self Care | Payer: Medicaid Other | Source: Ambulatory Visit | Attending: Obstetrics and Gynecology | Admitting: Obstetrics and Gynecology

## 2019-12-05 DIAGNOSIS — Z20822 Contact with and (suspected) exposure to covid-19: Secondary | ICD-10-CM | POA: Insufficient documentation

## 2019-12-05 DIAGNOSIS — Z01812 Encounter for preprocedural laboratory examination: Secondary | ICD-10-CM | POA: Insufficient documentation

## 2019-12-05 LAB — SARS CORONAVIRUS 2 (TAT 6-24 HRS): SARS Coronavirus 2: NEGATIVE

## 2019-12-06 ENCOUNTER — Other Ambulatory Visit: Payer: Self-pay | Admitting: Obstetrics and Gynecology

## 2019-12-07 ENCOUNTER — Inpatient Hospital Stay (HOSPITAL_COMMUNITY): Payer: Medicaid Other | Admitting: Anesthesiology

## 2019-12-07 ENCOUNTER — Encounter (HOSPITAL_COMMUNITY): Payer: Self-pay | Admitting: Obstetrics and Gynecology

## 2019-12-07 ENCOUNTER — Inpatient Hospital Stay (HOSPITAL_COMMUNITY): Payer: Medicaid Other

## 2019-12-07 ENCOUNTER — Other Ambulatory Visit: Payer: Self-pay

## 2019-12-07 ENCOUNTER — Inpatient Hospital Stay (HOSPITAL_COMMUNITY)
Admission: AD | Admit: 2019-12-07 | Discharge: 2019-12-11 | DRG: 786 | Disposition: A | Discharge status: Home / Self Care | Payer: Medicaid Other | Attending: Obstetrics and Gynecology | Admitting: Obstetrics and Gynecology

## 2019-12-07 DIAGNOSIS — D62 Acute posthemorrhagic anemia: Secondary | ICD-10-CM | POA: Diagnosis not present

## 2019-12-07 DIAGNOSIS — O411230 Chorioamnionitis, third trimester, not applicable or unspecified: Secondary | ICD-10-CM | POA: Diagnosis present

## 2019-12-07 DIAGNOSIS — Z3A39 39 weeks gestation of pregnancy: Secondary | ICD-10-CM | POA: Diagnosis not present

## 2019-12-07 DIAGNOSIS — Z87891 Personal history of nicotine dependence: Secondary | ICD-10-CM | POA: Diagnosis not present

## 2019-12-07 DIAGNOSIS — O9081 Anemia of the puerperium: Secondary | ICD-10-CM | POA: Diagnosis not present

## 2019-12-07 DIAGNOSIS — O133 Gestational [pregnancy-induced] hypertension without significant proteinuria, third trimester: Secondary | ICD-10-CM | POA: Diagnosis present

## 2019-12-07 DIAGNOSIS — Z20822 Contact with and (suspected) exposure to covid-19: Secondary | ICD-10-CM | POA: Diagnosis present

## 2019-12-07 DIAGNOSIS — F419 Anxiety disorder, unspecified: Secondary | ICD-10-CM | POA: Diagnosis present

## 2019-12-07 DIAGNOSIS — O99344 Other mental disorders complicating childbirth: Secondary | ICD-10-CM | POA: Diagnosis present

## 2019-12-07 DIAGNOSIS — O134 Gestational [pregnancy-induced] hypertension without significant proteinuria, complicating childbirth: Secondary | ICD-10-CM | POA: Diagnosis present

## 2019-12-07 LAB — COMPREHENSIVE METABOLIC PANEL
ALT: 11 U/L (ref 0–44)
AST: 22 U/L (ref 15–41)
Albumin: 3.1 g/dL — ABNORMAL LOW (ref 3.5–5.0)
Alkaline Phosphatase: 124 U/L (ref 38–126)
Anion gap: 10 (ref 5–15)
BUN: 5 mg/dL — ABNORMAL LOW (ref 6–20)
CO2: 22 mmol/L (ref 22–32)
Calcium: 9.3 mg/dL (ref 8.9–10.3)
Chloride: 106 mmol/L (ref 98–111)
Creatinine, Ser: 0.61 mg/dL (ref 0.44–1.00)
GFR calc Af Amer: >60 mL/min (ref >60)
GFR calc non Af Amer: >60 mL/min (ref >60)
Glucose, Bld: 82 mg/dL (ref 70–99)
Potassium: 3.4 mmol/L — ABNORMAL LOW (ref 3.5–5.1)
Sodium: 138 mmol/L (ref 135–145)
Total Bilirubin: 0.6 mg/dL (ref 0.3–1.2)
Total Protein: 6.2 g/dL — ABNORMAL LOW (ref 6.5–8.1)

## 2019-12-07 LAB — CBC
HCT: 35.3 % — ABNORMAL LOW (ref 36.0–46.0)
Hemoglobin: 11.6 g/dL — ABNORMAL LOW (ref 12.0–15.0)
MCH: 30 pg (ref 26.0–34.0)
MCHC: 32.9 g/dL (ref 30.0–36.0)
MCV: 91.2 fL (ref 80.0–100.0)
Platelets: 170 10*3/uL (ref 150–400)
RBC: 3.87 MIL/uL (ref 3.87–5.11)
RDW: 13.4 % (ref 11.5–15.5)
WBC: 8.2 10*3/uL (ref 4.0–10.5)
nRBC: 0 % (ref 0.0–0.2)

## 2019-12-07 LAB — TYPE AND SCREEN
ABO/RH(D): O POS
Antibody Screen: NEGATIVE

## 2019-12-07 LAB — RPR: RPR Ser Ql: NONREACTIVE

## 2019-12-07 MED ORDER — ACETAMINOPHEN 325 MG PO TABS
650.0000 mg | ORAL_TABLET | ORAL | Status: DC | PRN
  Administered 2019-12-08 (×2): 650 mg via ORAL
  Filled 2019-12-07 (×2): qty 2

## 2019-12-07 MED ORDER — LIDOCAINE HCL (PF) 1 % IJ SOLN
INTRAMUSCULAR | Status: DC | PRN
  Administered 2019-12-07: 10 mL via EPIDURAL

## 2019-12-07 MED ORDER — SOD CITRATE-CITRIC ACID 500-334 MG/5ML PO SOLN
30.0000 mL | ORAL | Status: DC | PRN
  Filled 2019-12-07: qty 30

## 2019-12-07 MED ORDER — OXYCODONE-ACETAMINOPHEN 5-325 MG PO TABS: 1.0000 | ORAL_TABLET | ORAL | Status: DC | PRN

## 2019-12-07 MED ORDER — LACTATED RINGERS IV SOLN: INTRAVENOUS | Status: DC

## 2019-12-07 MED ORDER — BUTORPHANOL TARTRATE 1 MG/ML IJ SOLN
1.0000 mg | INTRAMUSCULAR | Status: DC | PRN
  Administered 2019-12-07 (×4): 1 mg via INTRAVENOUS
  Filled 2019-12-07 (×4): qty 1

## 2019-12-07 MED ORDER — OXYTOCIN-SODIUM CHLORIDE 30-0.9 UT/500ML-% IV SOLN
1.0000 m[IU]/min | INTRAVENOUS | Status: DC
  Administered 2019-12-07: 2 m[IU]/min via INTRAVENOUS
  Filled 2019-12-07: qty 500

## 2019-12-07 MED ORDER — PHENYLEPHRINE 40 MCG/ML (10ML) SYRINGE FOR IV PUSH (FOR BLOOD PRESSURE SUPPORT): 80.0000 ug | PREFILLED_SYRINGE | INTRAVENOUS | Status: DC | PRN

## 2019-12-07 MED ORDER — ONDANSETRON HCL 4 MG/2ML IJ SOLN
4.0000 mg | Freq: Four times a day (QID) | INTRAMUSCULAR | Status: DC | PRN
  Administered 2019-12-07: 4 mg via INTRAVENOUS
  Filled 2019-12-07 (×2): qty 2

## 2019-12-07 MED ORDER — LACTATED RINGERS IV SOLN
500.0000 mL | Freq: Once | INTRAVENOUS | Status: AC
  Administered 2019-12-07: 1000 mL via INTRAVENOUS

## 2019-12-07 MED ORDER — OXYTOCIN BOLUS FROM INFUSION: 333.0000 mL | Freq: Once | INTRAVENOUS | Status: DC

## 2019-12-07 MED ORDER — EPHEDRINE 5 MG/ML INJ: 10.0000 mg | INTRAVENOUS | Status: DC | PRN

## 2019-12-07 MED ORDER — SODIUM CHLORIDE (PF) 0.9 % IJ SOLN
INTRAMUSCULAR | Status: DC | PRN
  Administered 2019-12-07: 12 mL/h via EPIDURAL

## 2019-12-07 MED ORDER — DIPHENHYDRAMINE HCL 50 MG/ML IJ SOLN: 12.5000 mg | INTRAMUSCULAR | Status: DC | PRN

## 2019-12-07 MED ORDER — LACTATED RINGERS IV SOLN
500.0000 mL | INTRAVENOUS | Status: DC | PRN
  Administered 2019-12-08: 500 mL via INTRAVENOUS

## 2019-12-07 MED ORDER — OXYCODONE-ACETAMINOPHEN 5-325 MG PO TABS: 2.0000 | ORAL_TABLET | ORAL | Status: DC | PRN

## 2019-12-07 MED ORDER — PHENYLEPHRINE 40 MCG/ML (10ML) SYRINGE FOR IV PUSH (FOR BLOOD PRESSURE SUPPORT)
80.0000 ug | PREFILLED_SYRINGE | INTRAVENOUS | Status: DC | PRN
  Filled 2019-12-07 (×2): qty 10

## 2019-12-07 MED ORDER — LIDOCAINE HCL (PF) 1 % IJ SOLN: 30.0000 mL | INTRAMUSCULAR | Status: DC | PRN

## 2019-12-07 MED ORDER — TERBUTALINE SULFATE 1 MG/ML IJ SOLN: 0.2500 mg | Freq: Once | INTRAMUSCULAR | Status: DC | PRN

## 2019-12-07 MED ORDER — EPHEDRINE 5 MG/ML INJ
10.0000 mg | INTRAVENOUS | Status: DC | PRN
  Filled 2019-12-07: qty 10

## 2019-12-07 MED ORDER — MISOPROSTOL 25 MCG QUARTER TABLET
25.0000 ug | ORAL_TABLET | ORAL | Status: DC | PRN
  Administered 2019-12-07 (×3): 25 ug via VAGINAL
  Filled 2019-12-07 (×4): qty 1

## 2019-12-07 MED ORDER — FENTANYL-BUPIVACAINE-NACL 0.5-0.125-0.9 MG/250ML-% EP SOLN
12.0000 mL/h | EPIDURAL | Status: DC | PRN
  Administered 2019-12-08: 12 mL/h via EPIDURAL
  Filled 2019-12-07 (×2): qty 250

## 2019-12-07 MED ORDER — OXYTOCIN-SODIUM CHLORIDE 30-0.9 UT/500ML-% IV SOLN: 2.5000 [IU]/h | INTRAVENOUS | Status: DC

## 2019-12-07 NOTE — Anesthesia Preprocedure Evaluation (Addendum)
Anesthesia Evaluation  Patient identified by MRN, date of birth, ID band Patient awake    Reviewed: Allergy & Precautions, H&P , NPO status , Patient's Chart, lab work & pertinent test results  History of Anesthesia Complications (+) Family history of anesthesia reactionNegative for: history of anesthetic complications (father had possible seizures after anesthesia- occurred after arriving home, nothing that sounds concerning for MH)  Airway Mallampati: II  TM Distance: >3 FB Neck ROM: full    Dental no notable dental hx.    Pulmonary neg pulmonary ROS, former smoker,    Pulmonary exam normal        Cardiovascular hypertension (PIH), Normal cardiovascular exam Rhythm:regular Rate:Normal     Neuro/Psych negative neurological ROS  negative psych ROS   GI/Hepatic negative GI ROS, Neg liver ROS,   Endo/Other  negative endocrine ROS  Renal/GU negative Renal ROS  negative genitourinary   Musculoskeletal   Abdominal   Peds  Hematology negative hematology ROS (+)   Anesthesia Other Findings   Reproductive/Obstetrics (+) Pregnancy                            Anesthesia Physical Anesthesia Plan  ASA: II  Anesthesia Plan: Epidural   Post-op Pain Management:    Induction:   PONV Risk Score and Plan:   Airway Management Planned:   Additional Equipment:   Intra-op Plan:   Post-operative Plan:   Informed Consent: I have reviewed the patients History and Physical, chart, labs and discussed the procedure including the risks, benefits and alternatives for the proposed anesthesia with the patient or authorized representative who has indicated his/her understanding and acceptance.       Plan Discussed with:   Anesthesia Plan Comments:         Anesthesia Quick Evaluation

## 2019-12-07 NOTE — Anesthesia Procedure Notes (Signed)
Epidural Patient location during procedure: OB Start time: 12/07/2019 9:51 PM End time: 12/07/2019 10:08 PM  Staffing Anesthesiologist: Lucretia Kern, MD Performed: anesthesiologist   Preanesthetic Checklist Completed: patient identified, IV checked, risks and benefits discussed, monitors and equipment checked, pre-op evaluation and timeout performed  Epidural Patient position: sitting Prep: DuraPrep Patient monitoring: heart rate, continuous pulse ox and blood pressure Approach: midline Location: L3-L4 Injection technique: LOR air  Needle:  Needle type: Tuohy  Needle gauge: 17 G Needle length: 9 cm Needle insertion depth: 6 cm Catheter type: closed end flexible Catheter size: 19 Gauge Catheter at skin depth: 11 cm Test dose: negative  Assessment Events: blood not aspirated, injection not painful, no injection resistance, no paresthesia and negative IV test  Additional Notes Reason for block:procedure for pain

## 2019-12-07 NOTE — Progress Notes (Signed)
Having some cramps Afeb, VSS, BP stable VE-FT/30/-3, vtx, intracervical foley placed, intravaginal cytotec placed Will do this one more dose of cytotec with catheter in cervix, then try pitocin

## 2019-12-07 NOTE — Progress Notes (Signed)
Feeling ctx, catheter came out around 1915 Afeb, VSS, BP130-150/70-90 FHT-140, Cat I, ctx q 2-4 min VE-4/50/-2, vtx, AROM clear Will continue pitocin and titrate as needed, monitor progress and BP, anticipate SVD

## 2019-12-07 NOTE — H&P (Signed)
Alexis Hunt is a 24 y.o. female, G1P0, EGA [redacted] weeks with EDC 7-10 presenting for ripening and induction for borderline PIH.  She was admitted earlier this week for the same, all BP normal so sent home after 2 doses of cytotec.  Has been Rxed potassium for mild hypokalemia.  OB History    Gravida  1   Para  0   Term  0   Preterm  0   AB  0   Living  0     SAB  0   TAB  0   Ectopic  0   Multiple  0   Live Births  0          Past Medical History:  Diagnosis Date  . Anxiety   . Concussion   . Family history of adverse reaction to anesthesia    dad possible seizures  . Pregnancy induced hypertension    Past Surgical History:  Procedure Laterality Date  . NO PAST SURGERIES     Family History: family history includes Healthy in her father and mother; Heart disease in her maternal grandfather; Hyperlipidemia in her maternal grandfather, paternal grandfather, and paternal grandmother; Hypertension in her mother and paternal grandmother; Stroke in her maternal grandfather. Social History:  reports that she quit smoking about 2 months ago. Her smoking use included cigarettes. She smoked 0.25 packs per day. She has never used smokeless tobacco. She reports that she does not drink alcohol and does not use drugs.     Maternal Diabetes: No Genetic Screening: Declined Maternal Ultrasounds/Referrals: Normal Fetal Ultrasounds or other Referrals:  None Maternal Substance Abuse:  No Significant Maternal Medications:  None Significant Maternal Lab Results:  Group B Strep negative Other Comments:  None  Review of Systems  Respiratory: Negative.   Cardiovascular: Negative.    Maternal Medical History:  Fetal activity: Perceived fetal activity is normal.    Prenatal Complications - Diabetes: none.    Dilation: Fingertip Effacement (%): Thick Station: -2 Exam by:: Madalyn Rob, RN Blood pressure (!) 126/93, pulse 81, temperature 98.5 F (36.9 C), temperature  source Oral, resp. rate 18, height 5\' 1"  (1.549 m), weight 79.1 kg, last menstrual period 03/12/2019. Maternal Exam:  Uterine Assessment: Contraction strength is mild.  Contraction frequency is irregular.   Abdomen: Patient reports no abdominal tenderness. Estimated fetal weight is 7 lbs.   Fetal presentation: vertex  Introitus: Normal vulva. Normal vagina.  Amniotic fluid character: not assessed.  Pelvis: adequate for delivery.      Fetal Exam Fetal Monitor Review: Mode: ultrasound.   Baseline rate: 120-130.  Variability: moderate (6-25 bpm).   Pattern: accelerations present and no decelerations.    Fetal State Assessment: Category I - tracings are normal.     Physical Exam Vitals reviewed.  Constitutional:      Appearance: Normal appearance.  Cardiovascular:     Rate and Rhythm: Normal rate and regular rhythm.  Pulmonary:     Effort: Pulmonary effort is normal. No respiratory distress.  Abdominal:     Palpations: Abdomen is soft.  Genitourinary:    General: Normal vulva.  Neurological:     Mental Status: She is alert.     Prenatal labs: ABO, Rh: --/--/O POS (07/03 0015) Antibody: NEG (07/03 0015) Rubella: Immune (12/07 0000) RPR: NON REACTIVE (06/28 0005)  HBsAg: Negative (12/07 0000)  HIV: Non-reactive (12/07 0000)  GBS: Negative/-- (06/16 0000)   Assessment/Plan: IUP at 39 weeks, borderline PIH, unfavorable cervix.  Labs stable, K+ improved.  Received 2 doses of cytotec overnight, BP overall normal.  Will recheck around 0900 for further plan   Leighton Roach Jacqulin Brandenburger 12/07/2019, 8:09 AM

## 2019-12-08 ENCOUNTER — Encounter (HOSPITAL_COMMUNITY): Payer: Self-pay | Admitting: Obstetrics and Gynecology

## 2019-12-08 ENCOUNTER — Encounter (HOSPITAL_COMMUNITY)
Admission: AD | Disposition: A | Discharge status: Home / Self Care | Payer: Self-pay | Source: Home / Self Care | Attending: Obstetrics and Gynecology

## 2019-12-08 LAB — CBC
HCT: 27.7 % — ABNORMAL LOW (ref 36.0–46.0)
Hemoglobin: 9.3 g/dL — ABNORMAL LOW (ref 12.0–15.0)
MCH: 31.3 pg (ref 26.0–34.0)
MCHC: 33.6 g/dL (ref 30.0–36.0)
MCV: 93.3 fL (ref 80.0–100.0)
Platelets: 163 10*3/uL (ref 150–400)
RBC: 2.97 MIL/uL — ABNORMAL LOW (ref 3.87–5.11)
RDW: 13.3 % (ref 11.5–15.5)
WBC: 16.8 10*3/uL — ABNORMAL HIGH (ref 4.0–10.5)
nRBC: 0 % (ref 0.0–0.2)

## 2019-12-08 SURGERY — Surgical Case
Anesthesia: Epidural | Wound class: Clean Contaminated

## 2019-12-08 MED ORDER — CEFAZOLIN SODIUM-DEXTROSE 2-4 GM/100ML-% IV SOLN
INTRAVENOUS | Status: AC
  Filled 2019-12-08: qty 100

## 2019-12-08 MED ORDER — NALBUPHINE HCL 10 MG/ML IJ SOLN: 5.0000 mg | INTRAMUSCULAR | Status: DC | PRN

## 2019-12-08 MED ORDER — MAGNESIUM HYDROXIDE 400 MG/5ML PO SUSP: 30.0000 mL | ORAL | Status: DC | PRN

## 2019-12-08 MED ORDER — OXYCODONE HCL 5 MG/5ML PO SOLN: 5.0000 mg | Freq: Once | ORAL | Status: DC | PRN

## 2019-12-08 MED ORDER — NALBUPHINE HCL 10 MG/ML IJ SOLN: 5.0000 mg | Freq: Once | INTRAMUSCULAR | Status: DC | PRN

## 2019-12-08 MED ORDER — PHENYLEPHRINE 40 MCG/ML (10ML) SYRINGE FOR IV PUSH (FOR BLOOD PRESSURE SUPPORT)
PREFILLED_SYRINGE | INTRAVENOUS | Status: AC
  Filled 2019-12-08: qty 10

## 2019-12-08 MED ORDER — NALOXONE HCL 4 MG/10ML IJ SOLN
1.0000 ug/kg/h | INTRAVENOUS | Status: DC | PRN
  Filled 2019-12-08: qty 5

## 2019-12-08 MED ORDER — LIDOCAINE-EPINEPHRINE (PF) 2 %-1:200000 IJ SOLN
INTRAMUSCULAR | Status: AC
  Filled 2019-12-08: qty 10

## 2019-12-08 MED ORDER — METHYLERGONOVINE MALEATE 0.2 MG/ML IJ SOLN
INTRAMUSCULAR | Status: AC
  Filled 2019-12-08: qty 1

## 2019-12-08 MED ORDER — PHENYLEPHRINE 40 MCG/ML (10ML) SYRINGE FOR IV PUSH (FOR BLOOD PRESSURE SUPPORT)
80.0000 ug | PREFILLED_SYRINGE | Freq: Once | INTRAVENOUS | Status: AC | PRN
  Administered 2019-12-08: 80 ug via INTRAVENOUS

## 2019-12-08 MED ORDER — KETOROLAC TROMETHAMINE 30 MG/ML IJ SOLN
30.0000 mg | Freq: Four times a day (QID) | INTRAMUSCULAR | Status: AC
  Administered 2019-12-09 (×3): 30 mg via INTRAVENOUS
  Filled 2019-12-08 (×3): qty 1

## 2019-12-08 MED ORDER — DEXMEDETOMIDINE HCL IN NACL 200 MCG/50ML IV SOLN
INTRAVENOUS | Status: AC
  Filled 2019-12-08: qty 50

## 2019-12-08 MED ORDER — SODIUM CHLORIDE 0.9 % IR SOLN
Status: DC | PRN
  Administered 2019-12-08: 500 mL

## 2019-12-08 MED ORDER — NALOXONE HCL 0.4 MG/ML IJ SOLN: 0.4000 mg | INTRAMUSCULAR | Status: DC | PRN

## 2019-12-08 MED ORDER — OXYTOCIN-SODIUM CHLORIDE 30-0.9 UT/500ML-% IV SOLN
INTRAVENOUS | Status: DC | PRN
  Administered 2019-12-08: 400 mL via INTRAVENOUS

## 2019-12-08 MED ORDER — KETOROLAC TROMETHAMINE 30 MG/ML IJ SOLN
INTRAMUSCULAR | Status: AC
  Filled 2019-12-08: qty 1

## 2019-12-08 MED ORDER — SODIUM CHLORIDE 0.9 % IR SOLN
Status: DC | PRN
  Administered 2019-12-08: 1000 mL

## 2019-12-08 MED ORDER — NALBUPHINE HCL 10 MG/ML IJ SOLN
5.0000 mg | INTRAMUSCULAR | Status: DC | PRN
  Administered 2019-12-09: 5 mg via INTRAVENOUS
  Filled 2019-12-08: qty 1

## 2019-12-08 MED ORDER — ONDANSETRON HCL 4 MG/2ML IJ SOLN: 4.0000 mg | Freq: Three times a day (TID) | INTRAMUSCULAR | Status: DC | PRN

## 2019-12-08 MED ORDER — OXYTOCIN-SODIUM CHLORIDE 30-0.9 UT/500ML-% IV SOLN
INTRAVENOUS | Status: AC
  Filled 2019-12-08: qty 500

## 2019-12-08 MED ORDER — FENTANYL CITRATE (PF) 100 MCG/2ML IJ SOLN
INTRAMUSCULAR | Status: AC
  Filled 2019-12-08: qty 2

## 2019-12-08 MED ORDER — MORPHINE SULFATE (PF) 0.5 MG/ML IJ SOLN
INTRAMUSCULAR | Status: AC
  Filled 2019-12-08: qty 10

## 2019-12-08 MED ORDER — DIPHENHYDRAMINE HCL 25 MG PO CAPS: 25.0000 mg | ORAL_CAPSULE | Freq: Four times a day (QID) | ORAL | Status: DC | PRN

## 2019-12-08 MED ORDER — MORPHINE SULFATE (PF) 0.5 MG/ML IJ SOLN
INTRAMUSCULAR | Status: DC | PRN
  Administered 2019-12-08: 3 mg via EPIDURAL

## 2019-12-08 MED ORDER — ONDANSETRON HCL 4 MG/2ML IJ SOLN
INTRAMUSCULAR | Status: DC | PRN
  Administered 2019-12-08: 4 mg via INTRAVENOUS

## 2019-12-08 MED ORDER — SODIUM CHLORIDE 0.9 % IV SOLN
3.0000 g | Freq: Four times a day (QID) | INTRAVENOUS | Status: AC
  Administered 2019-12-08 – 2019-12-09 (×4): 3 g via INTRAVENOUS
  Filled 2019-12-08 (×2): qty 3
  Filled 2019-12-08: qty 8
  Filled 2019-12-08: qty 3
  Filled 2019-12-08: qty 8

## 2019-12-08 MED ORDER — MEPERIDINE HCL 25 MG/ML IJ SOLN
INTRAMUSCULAR | Status: AC
  Filled 2019-12-08: qty 1

## 2019-12-08 MED ORDER — DIPHENHYDRAMINE HCL 50 MG/ML IJ SOLN: 12.5000 mg | INTRAMUSCULAR | Status: DC | PRN

## 2019-12-08 MED ORDER — OXYCODONE HCL 5 MG PO TABS: 5.0000 mg | ORAL_TABLET | Freq: Once | ORAL | Status: DC | PRN

## 2019-12-08 MED ORDER — MISOPROSTOL 200 MCG PO TABS
800.0000 ug | ORAL_TABLET | Freq: Once | ORAL | Status: AC
  Administered 2019-12-08: 800 ug via RECTAL

## 2019-12-08 MED ORDER — ACETAMINOPHEN 500 MG PO TABS
1000.0000 mg | ORAL_TABLET | Freq: Four times a day (QID) | ORAL | Status: DC
  Administered 2019-12-09 – 2019-12-11 (×9): 1000 mg via ORAL
  Filled 2019-12-08 (×9): qty 2

## 2019-12-08 MED ORDER — MENTHOL 3 MG MT LOZG: 1.0000 | LOZENGE | OROMUCOSAL | Status: DC | PRN

## 2019-12-08 MED ORDER — MEASLES, MUMPS & RUBELLA VAC IJ SOLR: 0.5000 mL | Freq: Once | INTRAMUSCULAR | Status: DC

## 2019-12-08 MED ORDER — LACTATED RINGERS IV SOLN: INTRAVENOUS | Status: DC

## 2019-12-08 MED ORDER — FENTANYL CITRATE (PF) 100 MCG/2ML IJ SOLN
INTRAMUSCULAR | Status: DC | PRN
  Administered 2019-12-08: 100 ug via EPIDURAL

## 2019-12-08 MED ORDER — SENNOSIDES-DOCUSATE SODIUM 8.6-50 MG PO TABS
2.0000 | ORAL_TABLET | ORAL | Status: DC
  Administered 2019-12-09 – 2019-12-10 (×3): 2 via ORAL
  Filled 2019-12-08 (×3): qty 2

## 2019-12-08 MED ORDER — METHYLERGONOVINE MALEATE 0.2 MG/ML IJ SOLN
0.2000 mg | Freq: Once | INTRAMUSCULAR | Status: AC
  Administered 2019-12-08: 0.2 mg via INTRAMUSCULAR

## 2019-12-08 MED ORDER — LIDOCAINE-EPINEPHRINE (PF) 2 %-1:200000 IJ SOLN
INTRAMUSCULAR | Status: DC | PRN
  Administered 2019-12-08 (×8): 5 mL via EPIDURAL

## 2019-12-08 MED ORDER — COCONUT OIL OIL
1.0000 "application " | TOPICAL_OIL | Status: DC | PRN
  Administered 2019-12-10: 1 via TOPICAL

## 2019-12-08 MED ORDER — SODIUM CHLORIDE 0.9% FLUSH: 3.0000 mL | INTRAVENOUS | Status: DC | PRN

## 2019-12-08 MED ORDER — ONDANSETRON HCL 4 MG/2ML IJ SOLN
INTRAMUSCULAR | Status: AC
  Filled 2019-12-08: qty 2

## 2019-12-08 MED ORDER — MIDAZOLAM HCL 2 MG/2ML IJ SOLN
INTRAMUSCULAR | Status: DC | PRN
Start: 2019-12-08 — End: 2019-12-08
  Administered 2019-12-08 (×2): 1 mg via INTRAVENOUS

## 2019-12-08 MED ORDER — SIMETHICONE 80 MG PO CHEW
80.0000 mg | CHEWABLE_TABLET | ORAL | Status: DC | PRN
  Administered 2019-12-09: 80 mg via ORAL
  Filled 2019-12-08: qty 1

## 2019-12-08 MED ORDER — DEXMEDETOMIDINE HCL IN NACL 200 MCG/50ML IV SOLN
INTRAVENOUS | Status: DC | PRN
  Administered 2019-12-08 (×2): 8 ug via INTRAVENOUS
  Administered 2019-12-08: 4 ug via INTRAVENOUS
  Administered 2019-12-08 (×2): 8 ug via INTRAVENOUS
  Administered 2019-12-08: 4 ug via INTRAVENOUS

## 2019-12-08 MED ORDER — MEPERIDINE HCL 25 MG/ML IJ SOLN
INTRAMUSCULAR | Status: DC | PRN
  Administered 2019-12-08: 12.5 mg via INTRAVENOUS

## 2019-12-08 MED ORDER — LACTATED RINGERS IV SOLN: INTRAVENOUS | Status: DC | PRN

## 2019-12-08 MED ORDER — TETANUS-DIPHTH-ACELL PERTUSSIS 5-2.5-18.5 LF-MCG/0.5 IM SUSP: 0.5000 mL | Freq: Once | INTRAMUSCULAR | Status: DC

## 2019-12-08 MED ORDER — PRENATAL MULTIVITAMIN CH
1.0000 | ORAL_TABLET | Freq: Every day | ORAL | Status: DC
  Administered 2019-12-10 – 2019-12-11 (×2): 1 via ORAL
  Filled 2019-12-08 (×3): qty 1

## 2019-12-08 MED ORDER — METHYLERGONOVINE MALEATE 0.2 MG PO TABS: 0.2000 mg | ORAL_TABLET | ORAL | Status: DC | PRN

## 2019-12-08 MED ORDER — OXYTOCIN-SODIUM CHLORIDE 30-0.9 UT/500ML-% IV SOLN
2.5000 [IU]/h | INTRAVENOUS | Status: DC
  Administered 2019-12-09: 2.5 [IU]/h via INTRAVENOUS
  Filled 2019-12-08: qty 500

## 2019-12-08 MED ORDER — IBUPROFEN 800 MG PO TABS
800.0000 mg | ORAL_TABLET | Freq: Three times a day (TID) | ORAL | Status: DC
  Administered 2019-12-09 – 2019-12-11 (×5): 800 mg via ORAL
  Filled 2019-12-08 (×5): qty 1

## 2019-12-08 MED ORDER — METHYLERGONOVINE MALEATE 0.2 MG/ML IJ SOLN: 0.2000 mg | INTRAMUSCULAR | Status: DC | PRN

## 2019-12-08 MED ORDER — PROMETHAZINE HCL 25 MG/ML IJ SOLN: 6.2500 mg | INTRAMUSCULAR | Status: DC | PRN

## 2019-12-08 MED ORDER — SCOPOLAMINE 1 MG/3DAYS TD PT72: 1.0000 | MEDICATED_PATCH | Freq: Once | TRANSDERMAL | Status: DC

## 2019-12-08 MED ORDER — KETOROLAC TROMETHAMINE 30 MG/ML IJ SOLN
30.0000 mg | Freq: Once | INTRAMUSCULAR | Status: AC | PRN
  Administered 2019-12-08: 30 mg via INTRAVENOUS

## 2019-12-08 MED ORDER — MISOPROSTOL 200 MCG PO TABS
ORAL_TABLET | ORAL | Status: AC
  Filled 2019-12-08: qty 4

## 2019-12-08 MED ORDER — FENTANYL CITRATE (PF) 100 MCG/2ML IJ SOLN
25.0000 ug | INTRAMUSCULAR | Status: DC | PRN
  Administered 2019-12-08: 50 ug via INTRAVENOUS
  Administered 2019-12-08 (×2): 25 ug via INTRAVENOUS

## 2019-12-08 MED ORDER — LORAZEPAM 2 MG/ML IJ SOLN
1.0000 mg | Freq: Four times a day (QID) | INTRAMUSCULAR | Status: DC | PRN
  Administered 2019-12-08: 1 mg via INTRAVENOUS
  Filled 2019-12-08: qty 1

## 2019-12-08 MED ORDER — SCOPOLAMINE 1 MG/3DAYS TD PT72
MEDICATED_PATCH | TRANSDERMAL | Status: AC
  Filled 2019-12-08: qty 1

## 2019-12-08 MED ORDER — DIPHENHYDRAMINE HCL 25 MG PO CAPS: 25.0000 mg | ORAL_CAPSULE | ORAL | Status: DC | PRN

## 2019-12-08 MED ORDER — MIDAZOLAM HCL 2 MG/2ML IJ SOLN
INTRAMUSCULAR | Status: AC
  Filled 2019-12-08: qty 2

## 2019-12-08 MED ORDER — MEPERIDINE HCL 25 MG/ML IJ SOLN: 6.2500 mg | INTRAMUSCULAR | Status: DC | PRN

## 2019-12-08 MED ORDER — OXYCODONE HCL 5 MG PO TABS
5.0000 mg | ORAL_TABLET | ORAL | Status: DC | PRN
  Administered 2019-12-09: 5 mg via ORAL
  Administered 2019-12-09: 10 mg via ORAL
  Administered 2019-12-09 – 2019-12-10 (×2): 5 mg via ORAL
  Administered 2019-12-10 – 2019-12-11 (×3): 10 mg via ORAL
  Filled 2019-12-08 (×2): qty 1
  Filled 2019-12-08 (×4): qty 2
  Filled 2019-12-08: qty 1

## 2019-12-08 MED ORDER — ZOLPIDEM TARTRATE 5 MG PO TABS: 5.0000 mg | ORAL_TABLET | Freq: Every evening | ORAL | Status: DC | PRN

## 2019-12-08 SURGICAL SUPPLY — 38 items
APL SKNCLS STERI-STRIP NONHPOA (GAUZE/BANDAGES/DRESSINGS) ×1
BENZOIN TINCTURE PRP APPL 2/3 (GAUZE/BANDAGES/DRESSINGS) ×2 IMPLANT
CHLORAPREP W/TINT 26ML (MISCELLANEOUS) ×3 IMPLANT
CLAMP CORD UMBIL (MISCELLANEOUS) IMPLANT
CLOSURE STERI-STRIP 1/2X4 (GAUZE/BANDAGES/DRESSINGS) ×1
CLOTH BEACON ORANGE TIMEOUT ST (SAFETY) ×3 IMPLANT
CLSR STERI-STRIP ANTIMIC 1/2X4 (GAUZE/BANDAGES/DRESSINGS) ×1 IMPLANT
DRSG OPSITE POSTOP 4X10 (GAUZE/BANDAGES/DRESSINGS) ×3 IMPLANT
ELECT REM PT RETURN 9FT ADLT (ELECTROSURGICAL) ×3
ELECTRODE REM PT RTRN 9FT ADLT (ELECTROSURGICAL) ×1 IMPLANT
EXTRACTOR VACUUM KIWI (MISCELLANEOUS) IMPLANT
EXTRACTOR VACUUM M CUP 4 TUBE (SUCTIONS) IMPLANT
EXTRACTOR VACUUM M CUP 4' TUBE (SUCTIONS)
GLOVE BIOGEL PI IND STRL 7.0 (GLOVE) ×1 IMPLANT
GLOVE BIOGEL PI INDICATOR 7.0 (GLOVE) ×2
GLOVE ORTHO TXT STRL SZ7.5 (GLOVE) ×3 IMPLANT
GOWN STRL REUS W/TWL LRG LVL3 (GOWN DISPOSABLE) ×6 IMPLANT
KIT ABG SYR 3ML LUER SLIP (SYRINGE) IMPLANT
NDL HYPO 25X5/8 SAFETYGLIDE (NEEDLE) ×1 IMPLANT
NEEDLE HYPO 25X5/8 SAFETYGLIDE (NEEDLE) ×3 IMPLANT
NS IRRIG 1000ML POUR BTL (IV SOLUTION) ×3 IMPLANT
PACK C SECTION WH (CUSTOM PROCEDURE TRAY) ×3 IMPLANT
PAD ABD 8X10 STRL (GAUZE/BANDAGES/DRESSINGS) ×3 IMPLANT
PAD OB MATERNITY 4.3X12.25 (PERSONAL CARE ITEMS) ×3 IMPLANT
PENCIL SMOKE EVAC W/HOLSTER (ELECTROSURGICAL) ×3 IMPLANT
RTRCTR C-SECT PINK 25CM LRG (MISCELLANEOUS) ×3 IMPLANT
SPONGE GAUZE 4X4 12PLY STER LF (GAUZE/BANDAGES/DRESSINGS) ×4 IMPLANT
SUT CHROMIC 1 CTX 36 (SUTURE) ×6 IMPLANT
SUT PLAIN 0 NONE (SUTURE) IMPLANT
SUT PLAIN 2 0 XLH (SUTURE) IMPLANT
SUT VIC AB 0 CT1 27 (SUTURE) ×6
SUT VIC AB 0 CT1 27XBRD ANBCTR (SUTURE) ×2 IMPLANT
SUT VIC AB 2-0 CT1 (SUTURE) ×3 IMPLANT
SUT VIC AB 4-0 KS 27 (SUTURE) IMPLANT
TAPE MEDIFIX FOAM 3 (GAUZE/BANDAGES/DRESSINGS) ×2 IMPLANT
TOWEL OR 17X24 6PK STRL BLUE (TOWEL DISPOSABLE) ×3 IMPLANT
TRAY FOLEY W/BAG SLVR 14FR LF (SET/KITS/TRAYS/PACK) ×3 IMPLANT
WATER STERILE IRR 1000ML POUR (IV SOLUTION) ×3 IMPLANT

## 2019-12-08 NOTE — Progress Notes (Signed)
More comfortable with epidural re-dosed Afeb, VSS, BP stable FHT-150-160, Cat I, ctx q 2-3 min VE-6/70/-1, vtx  Cervix has definitely changed since my last exam.  Discussed she should now be in active labor, will continue pitocin, monitor progress

## 2019-12-08 NOTE — Progress Notes (Signed)
Mostly comfortable with epidural Temp 101.1, VSS, BP 140-150/80-100 FHT-180s, + scalp stim, Cat I, ctx q 2-3 min VE-6/80/-1, vtx  She has been started on Unasyn for Triple I.  Discussed no significant change in cervix over the past 4 hours, 2 cm of change in almost 24 hours.  Recommended c-section for arrest of dilation, discussed procedure and risks.  OR notified, will proceed when they are ready

## 2019-12-08 NOTE — Plan of Care (Signed)
L&D careplan completed 

## 2019-12-08 NOTE — Transfer of Care (Signed)
Immediate Anesthesia Transfer of Care Note  Patient: Alexis Hunt  Procedure(s) Performed: CESAREAN SECTION (N/A )  Patient Location: PACU  Anesthesia Type:Epidural  Level of Consciousness: awake, alert  and patient cooperative  Airway & Oxygen Therapy: Patient Spontanous Breathing  Post-op Assessment: Report given to RN and Post -op Vital signs reviewed and stable  Post vital signs: Reviewed and stable  Last Vitals:  Vitals Value Taken Time  BP 126/83 12/08/19 2055  Temp    Pulse 137 12/08/19 2100  Resp 15 12/08/19 2100  SpO2 97 % 12/08/19 2100  Vitals shown include unvalidated device data.  Last Pain:  Vitals:   12/08/19 1821  TempSrc: Oral  PainSc:          Complications: No complications documented.

## 2019-12-08 NOTE — Anesthesia Postprocedure Evaluation (Signed)
Anesthesia Post Note  Patient: Alexis Hunt  Procedure(s) Performed: CESAREAN SECTION (N/A )     Patient location during evaluation: PACU Anesthesia Type: Epidural Level of consciousness: oriented and awake and alert Pain management: pain level controlled Vital Signs Assessment: post-procedure vital signs reviewed and stable Respiratory status: spontaneous breathing, respiratory function stable and nonlabored ventilation Cardiovascular status: blood pressure returned to baseline and stable Postop Assessment: no headache, no backache, no apparent nausea or vomiting and epidural receding Anesthetic complications: no   No complications documented.  Last Vitals:  Vitals:   12/08/19 2200 12/08/19 2215  BP: 118/85 132/87  Pulse: (!) 132 (!) 138  Resp: 12 (!) 21  Temp:    SpO2: 98% 98%    Last Pain:  Vitals:   12/08/19 2215  TempSrc:   PainSc: 3    Pain Goal:                Epidural/Spinal Function Cutaneous sensation: Able to Discern Pressure (12/08/19 2215), Patient able to flex knees: Yes (12/08/19 2215), Patient able to lift hips off bed: Yes (12/08/19 2215), Back pain beyond tenderness at insertion site: No (12/08/19 2215), Progressively worsening motor and/or sensory loss: No (12/08/19 2215), Bowel and/or bladder incontinence post epidural: No (12/08/19 2215)  Lucretia Kern

## 2019-12-08 NOTE — Op Note (Signed)
Preoperative diagnosis: Intrauterine pregnancy at 39 weeks, arrest of dilation, PIH, probable chorioamnionitis Postoperative diagnosis: Same Procedure: Primary low transverse cesarean section without extensions Surgeon: Lavina Hamman M.D. Anesthesia: Epidural Findings: Patient had normal gravid anatomy and delivered a viable female infant with Apgars of 8 and 8 weight pending Estimated blood loss: 500 cc Specimens: Placenta for routine pathology Complications: None  Procedure in detail: The patient was taken to the operating room and placed in the dorsosupine position with left tilt. Abdomen was then prepped and draped in the usual sterile fashion, a foley catheter had previously been inserted. The level of her anesthesia was found to be adequate. Abdomen was entered via a standard Pfannenstiel incision. Once the peritoneal cavity was entered the Alexis disposable self-retaining retractor was placed and good visualization was achieved. A 4 cm transverse incision was then made in the lower uterine segment pushing the bladder inferior. Once the uterine cavity was entered the incision was extended digitally, clear amniotic fluid. The fetal vertex was grasped and delivered through the incision atraumatically. Mouth and nares were suctioned. The remainder of the infant then delivered atraumatically. Cord was doubly clamped and cut after one minute and the infant handed to the awaiting pediatric team. Cord blood was obtained. The placenta delivered spontaneously. Uterus was wiped dry with clean lap pad and all clots and debris were removed. Uterine incision was inspected and found to be free of extensions. Uterine incision was closed in 2 layers with running locking #1 Chromic for the first layer, followed by a second imbricating layer also with #1 Chromic. Tubes and ovaries were inspected and found to be normal. Uterine incision was inspected and found to be hemostatic. Bleeding from serosal edges was  controlled with electrocautery. The Alexis retractor was removed. Subfascial space was irrigated and made hemostatic with electrocautery. Peritoneum was closed with 2-0 Vicryl.  Fascia was closed in running fashion starting at both ends and meeting in the middle with 0 Vicryl. Subcutaneous tissue was then irrigated and made hemostatic with electrocautery. Skin was closed with running 4-0 Vicryl subcuticular suture followed by steri-strips and a sterile dressing. Patient tolerated the procedure well and was taken to the recovery in stable condition. Counts were correct x2, she had PAS hose on throughout the procedure, she had received Unasyn shortly before the surgery for probable chorioamnionitis and this will be continued.Marland Kitchen

## 2019-12-08 NOTE — Progress Notes (Signed)
Epidural helping with pain, but feels pulling on catheter and epidural not working as well as it was, feeling anxious Afeb, VSS FHT-150s, mod variability, + scalp stim, + accels, occ small variable decel, ctx q 2-3 min, Cat II VE-4-5/50/-1, vtx, IUPC inserted  Minimal change in cervix over the 12 hrs since AROM, not in active labor yet.  IUPC inserted to better monitor ctx.  Discussed situation, recommended at least 18 hours from ROM to get her into labor, so will continue to monitor progress, adjust pitocin prn.  Will give prn Ativan for anxiety

## 2019-12-09 ENCOUNTER — Encounter (HOSPITAL_COMMUNITY): Payer: Self-pay | Admitting: Obstetrics and Gynecology

## 2019-12-09 LAB — CBC
HCT: 21.4 % — ABNORMAL LOW (ref 36.0–46.0)
Hemoglobin: 7.1 g/dL — ABNORMAL LOW (ref 12.0–15.0)
MCH: 31.3 pg (ref 26.0–34.0)
MCHC: 33.2 g/dL (ref 30.0–36.0)
MCV: 94.3 fL (ref 80.0–100.0)
Platelets: 136 10*3/uL — ABNORMAL LOW (ref 150–400)
RBC: 2.27 MIL/uL — ABNORMAL LOW (ref 3.87–5.11)
RDW: 13.5 % (ref 11.5–15.5)
WBC: 14.1 10*3/uL — ABNORMAL HIGH (ref 4.0–10.5)
nRBC: 0 % (ref 0.0–0.2)

## 2019-12-09 MED ORDER — SODIUM CHLORIDE 0.9 % IV SOLN
510.0000 mg | Freq: Once | INTRAVENOUS | Status: AC
  Administered 2019-12-09: 510 mg via INTRAVENOUS
  Filled 2019-12-09: qty 17

## 2019-12-09 MED ORDER — LACTATED RINGERS IV BOLUS
500.0000 mL | Freq: Once | INTRAVENOUS | Status: AC
  Administered 2019-12-09: 500 mL via INTRAVENOUS

## 2019-12-09 MED ORDER — LORAZEPAM 0.5 MG PO TABS
1.0000 mg | ORAL_TABLET | Freq: Four times a day (QID) | ORAL | Status: DC | PRN
  Administered 2019-12-09 – 2019-12-10 (×2): 1 mg via ORAL
  Filled 2019-12-09 (×2): qty 2

## 2019-12-09 MED ORDER — DIBUCAINE (PERIANAL) 1 % EX OINT: 1.0000 "application " | TOPICAL_OINTMENT | CUTANEOUS | Status: DC | PRN

## 2019-12-09 MED ORDER — WITCH HAZEL-GLYCERIN EX PADS: 1.0000 "application " | MEDICATED_PAD | CUTANEOUS | Status: DC | PRN

## 2019-12-09 NOTE — Progress Notes (Signed)
CSW received consult for hx of Anxiety. CSW met with MOB to offer support and complete assessment.    CSW congratulated MOB on the birth of infant. CSW observed that MOB had other guess in the room therefore advised MOB of the HIPPA policy. MOB reported that it was fine for her mom and FIB to remain in the room while CSW spoke with her. CSW understanding and advised MOB of CSW's role and the reason for CSW coming to see her. MOB reported that she was never really diagnosed with anxiety but "I was seen in the ER and Urgent Cares on multiple occassions and was told that I have anxiety". 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 MOB does report  A hx of anxiety in her childhood but reports "it comes and goes. I can go a year or more without anything and then ill have a panic attacks or something". MOB reported that she take Hydroxzine PRN for her anxiety. MOB reported no recent use of this medication and reported no desire for therapy resources at this time. MOB denies SI and HI and reported that her anxiety has been well controlled during this pregnancy "aside from last night when they told me that I was going to have to have a c-section. I almost passed out. 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 CSW validated this as a fear for MOB. MOB reported that she has been feelings well since.   CSW was informed that MOB has support from her parents as well as FOB. MOB reported that she has all needed items to care for infant with no other needs at this time. Infant will sleep in basinet and get follow up care at [facility] per MOB.   CSW provided education regarding the baby blues period vs. perinatal mood disorders, discussed treatment and gave resources for mental health follow up if concerns arise.  CSW recommends self-evaluation during the postpartum time period using the New Mom Checklist from Postpartum Progress and encouraged MOB to contact a medical  professional if symptoms are noted at any time.   CSW provided review of Sudden Infant Death Syndrome (SIDS) precautions.   CSW identifies no further need for intervention and no barriers to discharge at this time.   Nakoa Ganus S. Usiel Astarita, MSW, LCSW [facility] [phone]  

## 2019-12-09 NOTE — Progress Notes (Signed)
Subjective: Postpartum Day 1: Cesarean Delivery Patient reports incisional pain and tolerating PO.  Has tried to stand beside bed a few times and legs felt weak but no dizziness.  Thinks more anxiety than physical issue. Bleeding WNL  Objective: Vital signs in last 24 hours: Temp:  [97.6 F (36.4 C)-101.1 F (38.4 C)] 97.7 F (36.5 C) (07/05 0603) Pulse Rate:  [88-139] 112 (07/05 0745) Resp:  [12-24] 16 (07/05 0603) BP: (61-156)/(34-113) 134/78 (07/05 0603) SpO2:  [89 %-100 %] 97 % (07/05 0745)  Physical Exam:  General: alert and cooperative Lochia: appropriate Uterine Fundus: firm Incision: C/D/I   Recent Labs    12/08/19 2215 12/09/19 0517  HGB 9.3* 7.1*  HCT 27.7* 21.4*    Assessment/Plan: Status post Cesarean section. Postoperative course complicated by relative anemia from acute blood loss at surgery  Pt has not yet attempted ambulation.  Urine a bit concentrated.  Will give bolus and then see how does with ambulating.  D/w pt risks/benefits of transfusion if she is too symptomatic and she is agreeable if needed.  If tolerates ambulating will give IV feraheme, if not will transfuse one unit.  Unasyn x 2 more doses then d/c. BP WNL thus far.   Oliver Pila 12/09/2019, 9:07 AM

## 2019-12-09 NOTE — Progress Notes (Signed)
Patient ID: Alexis Hunt, female   DOB: 24-Aug-1995, 24 y.o.   MRN: [id number] Update Pt has been able to ambulate and have no dizziness at all.  Foley cath pulled.   Will give dose of fereheme as does not tolerate po iron very well.

## 2019-12-10 ENCOUNTER — Encounter (HOSPITAL_COMMUNITY): Payer: Self-pay | Admitting: Obstetrics and Gynecology

## 2019-12-10 NOTE — Lactation Note (Addendum)
This note was copied from a baby's chart. Lactation Consultation Note  Patient Name: Alexis Hunt [id number] Date: 12/10/2019 Reason for consult: Initial assessment;Mother's request;Difficult latch;Term P1, infant been seeing by [facility] services for the first time at 28 hours of life due to [facility] order recently being assigned. Mom hx: C/S, CHTN and anxiety  Infant had 5 voids and 6 stools since birth. Infant is currently cluster feeding. Concerns:  mom has nipple trauma , fissures and  abrasions, blood in breast milk due mom being fitted incorrectly with wrong breast flange while using the  DEBP. LC refitted mom with 27 mm breast flange for pumping and mom will apply coconut oil and EBM to help with breast soreness. Mom latched infant on her left breast using the football hold position, after few attempts infant sustained latch and breastfeed for 25 minutes. Infant was supplemented with 5 mls of mom's EBM at the breast using curve tip syringe while BF.  Per mom, she only feels a tug and infant sustained latch while feeding.  Mom will continue to BF according to cues, on demand, 8 to 12+ times within 24 hours.  Mom knows to call RN or LC if she needs further assistance with latching infant at breast. Temporarily mom will only pump on her left breast and will latch infant and hand express right breast until tomorrow morning  allowing time for breast to heal.  Mom made aware of O/P services, breastfeeding support groups, community resources, and our phone # for post-discharge questions.   Maternal Data Formula Feeding for Exclusion: No Has patient been taught Hand Expression?: Yes  Feeding Feeding Type: Breast Fed  LATCH Score Latch: Grasps breast easily, tongue down, lips flanged, rhythmical sucking.  Audible Swallowing: Spontaneous and intermittent  Type of Nipple: Everted at rest and after stimulation  Comfort (Breast/Nipple): Engorged, cracked, bleeding, large blisters, severe  discomfort  Hold (Positioning): Assistance needed to correctly position infant at breast and maintain latch.  LATCH Score: 7  Interventions Interventions: Assisted with latch;Breast compression;Breast feeding basics reviewed;Adjust position;Skin to skin;Support pillows;Breast massage;Position options;Expressed milk;DEBP;Comfort gels  Lactation Tools Discussed/Used WIC Program: No Pump Review: Milk Storage;Setup, frequency, and cleaning Initiated by:: by RN Date initiated:: 12/09/19   Consult Status Consult Status: Follow-up Date: 12/10/19 Follow-up type: In-patient    [name] 12/10/2019, 12:27 AM

## 2019-12-10 NOTE — Lactation Note (Signed)
This note was copied from a baby's chart. Lactation Consultation Note  Patient Name: Alexis Hunt [id number] Date: 12/10/2019   Mom reports that "everything is a lot better now" since her visit with an LC early this morning.   When I entered room, RN was helping Mom to latch. Infant latched & Mom was comfortable. Swallows were spontaneous & frequent (verified by cervical auscultation). When infant released latch, nipple was rounded & shape was not distorted.  Mom's L nipple has a very mild compression stripe. Mom's  R nipple has a small blister in the center of the nipple surface with 2 tiny scabs.   Specifics of an asymmetric latch were shown via [employer].   Mom's questions were answered to her satisfaction. Lurline Hare [facility] 12/10/2019, 1:55 PM

## 2019-12-10 NOTE — Progress Notes (Signed)
Subjective: Postpartum Day 2: Cesarean Delivery Patient reports incisional pain, tolerating PO and no problems voiding.  Nl lochia, pain controlled.  Tolerating relative blood loss anemia.    Objective: Vital signs in last 24 hours: Temp:  [97.7 F (36.5 C)-99.3 F (37.4 C)] 97.7 F (36.5 C) (07/06 6834) Pulse Rate:  [83-122] 83 (07/06 0608) Resp:  [17-20] 20 (07/06 0608) BP: (116-141)/(77-94) 116/80 (07/06 0608) SpO2:  [98 %-100 %] 100 % (07/06 1962)  Physical Exam:  General: alert and no distress Lochia: appropriate Uterine Fundus: firm Incision: C/D/I, pressure dressing in place.      Recent Labs    12/08/19 2215 12/09/19 0517  HGB 9.3* 7.1*  HCT 27.7* 21.4*    Assessment/Plan: Status post Cesarean section. Doing well postoperatively.  Continue current care.  Plan for discharge tomorrow.    Alexis Hunt 12/10/2019, 7:51 AM

## 2019-12-11 LAB — SURGICAL PATHOLOGY

## 2019-12-11 MED ORDER — DOCUSATE SODIUM 100 MG PO CAPS
100.0000 mg | ORAL_CAPSULE | Freq: Two times a day (BID) | ORAL | 0 refills | Status: DC
Start: 2019-12-11 — End: 2021-09-07

## 2019-12-11 MED ORDER — IBUPROFEN 800 MG PO TABS: 800.0000 mg | ORAL_TABLET | Freq: Three times a day (TID) | ORAL | 1 refills | Status: DC

## 2019-12-11 MED ORDER — FERROUS SULFATE 325 (65 FE) MG PO TABS: 325.0000 mg | ORAL_TABLET | Freq: Two times a day (BID) | ORAL | 1 refills | Status: DC

## 2019-12-11 MED ORDER — OXYCODONE HCL 5 MG PO TABS: 5.0000 mg | ORAL_TABLET | Freq: Four times a day (QID) | ORAL | 0 refills | Status: DC | PRN

## 2019-12-11 NOTE — Discharge Summary (Signed)
Postpartum Discharge Summary  Date of Service updated     Patient Name: Alexis Hunt DOB: Jan 11, 1996 MRN: [id number]  Date of admission: 12/07/2019 Delivery date:12/08/2019  Delivering provider: Willis Modena, TODD  Date of discharge: 12/11/2019  Admitting diagnosis: PIH (pregnancy induced hypertension), third trimester [O13.3] Intrauterine pregnancy: [redacted]w[redacted]d    Secondary diagnosis:  Principal Problem:   Cesarean delivery delivered Active Problems:   PIH (pregnancy induced hypertension), third trimester   Arrest of dilation, delivered, current hospitalization  Additional problems: Triple I    Discharge diagnosis: Term Pregnancy Delivered and Anemia                                              Post partum procedures:none Augmentation: AROM, Pitocin and Cytotec Complications: Intrauterine Inflammation or infection (Chorioamniotis)  Hospital course: Induction of Labor With Cesarean Section   24y.o. yo G1P1001 at 331w1das admitted to the hospital 12/07/2019 for induction of labor. Patient had a labor course significant for borderline GHTN, triple I. The patient went for cesarean section due to Arrest of Dilation. Delivery details are as follows: Membrane Rupture Time/Date: 8:35 PM ,12/07/2019   Delivery Method:C-Section, Low Transverse  Details of operation can be found in separate operative Note.  Patient had an uncomplicated postpartum course. She is ambulating, tolerating a regular diet, passing flatus, and urinating well.  Patient is discharged home in stable condition on 12/11/19.      Newborn Data: Birth date:12/08/2019  Birth time:8:02 PM  Gender:Female  Living status:Living  Apgars:8 ,8  [id number]                                 Magnesium Sulfate received: No BMZ received: No Rhophylac:No MMR:No T-DaP:Given prenatally Flu: Yes Transfusion:No  Physical exam  Vitals:   12/10/19 1539 12/10/19 2227 12/11/19 0122 12/11/19 0529  BP: 138/87 (!) 139/92 134/78 127/81   Pulse: (!) 108 (!) 109  98  Resp: '20 19 18 16  ' Temp: 98 F (36.7 C) 97.9 F (36.6 C)  98.2 F (36.8 C)  TempSrc: Oral Oral  Oral  SpO2:  100% 100% 100%  Weight:      Height:       General: alert, cooperative and no distress Lochia: appropriate Uterine Fundus: firm Incision: Healing well with no significant drainage, No significant erythema, Dressing is clean, dry, and intact DVT Evaluation: No evidence of DVT seen on physical exam. Negative Homan's sign. No cords or calf tenderness. Labs: Lab Results  Component Value Date   WBC 14.1 (H) 12/09/2019   HGB 7.1 (L) 12/09/2019   HCT 21.4 (L) 12/09/2019   MCV 94.3 12/09/2019   PLT 136 (L) 12/09/2019   CMP Latest Ref Rng & Units 12/07/2019  Glucose 70 - 99 mg/dL 82  BUN 6 - 20 mg/dL 5(L)  Creatinine 0.44 - 1.00 mg/dL 0.61  Sodium 135 - 145 mmol/L 138  Potassium 3.5 - 5.1 mmol/L 3.4(L)  Chloride 98 - 111 mmol/L 106  CO2 22 - 32 mmol/L 22  Calcium 8.9 - 10.3 mg/dL 9.3  Total Protein 6.5 - 8.1 g/dL 6.2(L)  Total Bilirubin 0.3 - 1.2 mg/dL 0.6  Alkaline Phos 38 - 126 U/L 124  AST 15 - 41 U/L 22  ALT 0 - 44 U/L 11   Edinburgh  Score: Edinburgh Postnatal Depression Scale Screening Tool 12/10/2019  I have been able to laugh and see the funny side of things. 0  I have looked forward with enjoyment to things. 0  I have blamed myself unnecessarily when things went wrong. 1  I have been anxious or worried for no good reason. 2  I have felt scared or panicky for no good reason. 2  Things have been getting on top of me. 0  I have been so unhappy that I have had difficulty sleeping. 0  I have felt sad or miserable. 0  I have been so unhappy that I have been crying. 0  The thought of harming myself has occurred to me. 0  Edinburgh Postnatal Depression Scale Total 5      After visit meds:  Allergies as of 12/11/2019   No Known Allergies     Medication List    TAKE these medications   docusate sodium 100 MG capsule Commonly  known as: Colace Take 1 capsule (100 mg total) by mouth 2 (two) times daily.   ferrous sulfate 325 (65 FE) MG tablet Take 1 tablet (325 mg total) by mouth 2 (two) times daily with a meal. What changed: when to take this   fluticasone 50 MCG/ACT nasal spray Commonly known as: FLONASE Place 1 spray into both nostrils daily.   ibuprofen 800 MG tablet Commonly known as: ADVIL Take 1 tablet (800 mg total) by mouth every 8 (eight) hours.   multivitamin-prenatal 27-0.8 MG Tabs tablet Take 1 tablet by mouth daily at 12 noon.   oxyCODONE 5 MG immediate release tablet Commonly known as: Oxy IR/ROXICODONE Take 1 tablet (5 mg total) by mouth every 6 (six) hours as needed for severe pain or breakthrough pain.            Discharge Care Instructions  (From admission, onward)         Start     Ordered   12/11/19 0000  Leave dressing on - Keep it clean, dry, and intact until clinic visit        12/11/19 0931           Discharge home in stable condition Infant Feeding: Breast Infant Disposition:home with mother Discharge instruction: per After Visit Summary and Postpartum booklet. Activity: Advance as tolerated. Pelvic rest for 6 weeks.  Diet: routine diet Anticipated Birth Control: Unsure Postpartum Appointment:6 weeks Additional Postpartum F/U: Incision check 1 week and BP check 2wksd Future Appointments:No future appointments. Follow up Visit:      12/11/2019 Deliah Boston, MD

## 2019-12-11 NOTE — Progress Notes (Signed)
Subjective: Postpartum Day 2: Cesarean Delivery Patient reports incisional pain, tolerating PO and no problems voiding.  Nl lochia, pain controlled.  Tolerating relative blood loss anemia.    Objective: Vital signs in last 24 hours: Temp:  [97.9 F (36.6 C)-98.2 F (36.8 C)] 98.2 F (36.8 C) (07/07 0529) Pulse Rate:  [98-109] 98 (07/07 0529) Resp:  [16-20] 16 (07/07 0529) BP: (127-139)/(78-92) 127/81 (07/07 0529) SpO2:  [100 %] 100 % (07/07 0529)  Physical Exam:  General: alert and no distress Lochia: appropriate Uterine Fundus: firm Incision: C/D/I, pressure dressing in place.      Recent Labs    12/08/19 2215 12/09/19 0517  HGB 9.3* 7.1*  HCT 27.7* 21.4*    Assessment/Plan: Status post Cesarean section. Doing well postoperatively.  Continue current care.  Plan for discharge today. Anemia - acute 2/2 EBL, asx, d/c with iron/colace   Valerie Roys Myles Tavella 12/11/2019, 9:26 AM

## 2019-12-11 NOTE — Lactation Note (Addendum)
This note was copied from a baby's chart. Lactation Consultation Note  Patient Name: Alexis Hunt [id number] Date: 12/11/2019 Reason for consult: Follow-up assessment  P1 mother whose infant is now 81 hours old.  This is a term baby at 39+1 weeks.  Mother had no immediate questions/concerns related to breast feeding.  Her daughter has been cluster feeding and mother feels like she is latching and feeding better today.  Mother hears audible swallows with feedings.  Both of her nipples have had some slight trauma due to poor early latches, however, these are beginning to heal.  Mother states, "They are better."  She has been using coconut oil and I provided comfort gels with instructions for use.  Mother appreciative.    Engorgement prevention/treatment reviewed.  Mother has a manual pump at bedside.  Reviewed use.  Mother has a DEBP for home use and is aware to take her pump parts home with her.  She has our OP phone number for any questions/concerns after discharge.  Father present.  RN in room at the time of my visit assessing mother and baby.   Maternal Data    Feeding Feeding Type: Breast Fed  LATCH Score                   Interventions    Lactation Tools Discussed/Used     Consult Status Consult Status: Complete Date: 12/11/19 Follow-up type: Call as needed    Jonel Sick R Ronita Hargreaves 12/11/2019, 8:19 AM

## 2020-12-24 ENCOUNTER — Emergency Department (HOSPITAL_BASED_OUTPATIENT_CLINIC_OR_DEPARTMENT_OTHER)
Admission: EM | Admit: 2020-12-24 | Discharge: 2020-12-24 | Disposition: A | Discharge status: Home / Self Care | Payer: Medicaid Other | Source: Home / Self Care | Attending: Emergency Medicine | Admitting: Emergency Medicine

## 2020-12-24 ENCOUNTER — Emergency Department (HOSPITAL_BASED_OUTPATIENT_CLINIC_OR_DEPARTMENT_OTHER): Payer: Medicaid Other | Admitting: Radiology

## 2020-12-24 ENCOUNTER — Other Ambulatory Visit: Payer: Self-pay

## 2020-12-24 ENCOUNTER — Encounter (HOSPITAL_BASED_OUTPATIENT_CLINIC_OR_DEPARTMENT_OTHER): Payer: Self-pay

## 2020-12-24 ENCOUNTER — Emergency Department (HOSPITAL_BASED_OUTPATIENT_CLINIC_OR_DEPARTMENT_OTHER): Payer: Medicaid Other

## 2020-12-24 ENCOUNTER — Emergency Department (HOSPITAL_COMMUNITY)
Admission: EM | Admit: 2020-12-24 | Discharge: 2020-12-24 | Discharge status: Against Medical Advice | Payer: Medicaid Other | Attending: Emergency Medicine | Admitting: Emergency Medicine

## 2020-12-24 DIAGNOSIS — R0602 Shortness of breath: Secondary | ICD-10-CM | POA: Insufficient documentation

## 2020-12-24 DIAGNOSIS — R0789 Other chest pain: Secondary | ICD-10-CM | POA: Insufficient documentation

## 2020-12-24 DIAGNOSIS — R079 Chest pain, unspecified: Secondary | ICD-10-CM | POA: Insufficient documentation

## 2020-12-24 DIAGNOSIS — S46812A Strain of other muscles, fascia and tendons at shoulder and upper arm level, left arm, initial encounter: Secondary | ICD-10-CM

## 2020-12-24 DIAGNOSIS — R0781 Pleurodynia: Secondary | ICD-10-CM

## 2020-12-24 DIAGNOSIS — Z7952 Long term (current) use of systemic steroids: Secondary | ICD-10-CM | POA: Diagnosis not present

## 2020-12-24 DIAGNOSIS — Z87891 Personal history of nicotine dependence: Secondary | ICD-10-CM | POA: Insufficient documentation

## 2020-12-24 DIAGNOSIS — M25512 Pain in left shoulder: Secondary | ICD-10-CM | POA: Insufficient documentation

## 2020-12-24 LAB — BASIC METABOLIC PANEL
Anion gap: 11 (ref 5–15)
BUN: 7 mg/dL (ref 6–20)
CO2: 26 mmol/L (ref 22–32)
Calcium: 10.1 mg/dL (ref 8.9–10.3)
Chloride: 102 mmol/L (ref 98–111)
Creatinine, Ser: 0.56 mg/dL (ref 0.44–1.00)
GFR, Estimated: >60 mL/min (ref >60)
Glucose, Bld: 96 mg/dL (ref 70–99)
Potassium: 3.7 mmol/L (ref 3.5–5.1)
Sodium: 139 mmol/L (ref 135–145)

## 2020-12-24 LAB — PREGNANCY, URINE: Preg Test, Ur: NEGATIVE

## 2020-12-24 LAB — TROPONIN I (HIGH SENSITIVITY): Troponin I (High Sensitivity): <2 ng/L (ref <18)

## 2020-12-24 LAB — CBC
HCT: 43.3 % (ref 36.0–46.0)
Hemoglobin: 14.5 g/dL (ref 12.0–15.0)
MCH: 29.7 pg (ref 26.0–34.0)
MCHC: 33.5 g/dL (ref 30.0–36.0)
MCV: 88.7 fL (ref 80.0–100.0)
Platelets: 344 10*3/uL (ref 150–400)
RBC: 4.88 MIL/uL (ref 3.87–5.11)
RDW: 12.1 % (ref 11.5–15.5)
WBC: 10 10*3/uL (ref 4.0–10.5)
nRBC: 0 % (ref 0.0–0.2)

## 2020-12-24 MED ORDER — ACETAMINOPHEN 500 MG PO TABS
1000.0000 mg | ORAL_TABLET | Freq: Once | ORAL | Status: DC
  Filled 2020-12-24: qty 2

## 2020-12-24 MED ORDER — IBUPROFEN 400 MG PO TABS
400.0000 mg | ORAL_TABLET | Freq: Once | ORAL | Status: DC
  Filled 2020-12-24: qty 1

## 2020-12-24 MED ORDER — METHOCARBAMOL 750 MG PO TABS: 750.0000 mg | ORAL_TABLET | Freq: Three times a day (TID) | ORAL | 0 refills | Status: DC | PRN

## 2020-12-24 MED ORDER — IOHEXOL 350 MG/ML SOLN
100.0000 mL | Freq: Once | INTRAVENOUS | Status: AC | PRN
  Administered 2020-12-24: 100 mL via INTRAVENOUS

## 2020-12-24 NOTE — ED Triage Notes (Signed)
Pt reports left side chest pain, under left breast that radiates up to left shoulder.  Denies shortness of breath.  Has a "stabbing" pain when yawning or taking deep breaths.  Denies lightheadedness or dizziness.

## 2020-12-24 NOTE — ED Provider Notes (Signed)
[facility] EMERGENCY DEPT Provider Note   CSN: [id number] Arrival date & time: 12/24/20  1728     History Chief Complaint  Patient presents with   Chest Pain    Alexis Hunt is a 25 y.o. female.  Pt c/o left lower chest pain in the past day. Symptoms acute onset this AM at rest, constant, dull to sharp, pleuritic. Mild sob. No hx same pain. Denies chest wall or neck/back strain. No trauma or fall. No abd pain or nvd. No dysuria, hematuria or c/o. Denies any exertional cp or discomfort. Denies leg pain or swelling, no recent surgery, immobility, trauma or travel. No hx dvt or pe.  States talked to pcp w concern for PE. Denies heartburn or indigestion. No cough or uri symptoms. No fever or chills.   The history is provided by the patient.  Chest Pain Associated symptoms: shortness of breath   Associated symptoms: no abdominal pain, no back pain, no cough, no fever, no headache, no palpitations and no vomiting       Past Medical History:  Diagnosis Date   Anxiety    Cesarean delivery delivered 12/10/2019   Concussion    Family history of adverse reaction to anesthesia    dad possible seizures   Pregnancy induced hypertension     Patient Active Problem List   Diagnosis Date Noted   Cesarean delivery delivered 12/10/2019   Arrest of dilation, delivered, current hospitalization 12/08/2019   PIH (pregnancy induced hypertension), third trimester 12/02/2019   Gestational hypertension 11/25/2019   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    Past Surgical History:  Procedure Laterality Date   CESAREAN SECTION N/A 12/08/2019   Procedure: CESAREAN SECTION;  Surgeon: Lavina Hamman, MD;  Location: [facility] LD ORS;  Service: Obstetrics;  Laterality: N/A;   NO PAST SURGERIES       OB History     Gravida  1   Para  1   Term  1   Preterm  0   AB  0   Living  1      SAB  0   IAB  0   Ectopic  0   Multiple  0   Live Births  1           Family History  Problem  Relation Age of Onset   Healthy Mother    Hypertension Mother    Healthy Father    Heart disease Maternal Grandfather    Hyperlipidemia Maternal Grandfather    Stroke Maternal Grandfather    Hyperlipidemia Paternal Grandmother    Hypertension Paternal Grandmother    Hyperlipidemia Paternal Grandfather     Social History   Tobacco Use   Smoking status: Former    Packs/day: 0.25    Types: Cigarettes    Quit date: 10/02/2019    Years since quitting: 1.2   Smokeless tobacco: Never  Vaping Use   Vaping Use: Never used  Substance Use Topics   Alcohol use: No    Alcohol/week: 0.0 standard drinks   Drug use: No    Home Medications Prior to Admission medications   Medication Sig Start Date End Date Taking? Authorizing Provider  norethindrone-ethinyl estradiol (NECON) 0.5-35 MG-MCG tablet Take 1 tablet by mouth daily.   Yes [provider]  docusate sodium (COLACE) 100 MG capsule Take 1 capsule (100 mg total) by mouth 2 (two) times daily. 12/11/19   Shivaji, Valerie Roys, MD  ferrous sulfate 325 (65 FE) MG tablet Take 1 tablet (325 mg total)  by mouth 2 (two) times daily with a meal. 12/11/19   Shivaji, Valerie Roys, MD  fluticasone (FLONASE) 50 MCG/ACT nasal spray Place 1 spray into both nostrils daily. 07/19/19 08/18/19  Darr, Gerilyn Pilgrim, PA-C  ibuprofen (ADVIL) 800 MG tablet Take 1 tablet (800 mg total) by mouth every 8 (eight) hours. 12/11/19   Shivaji, Valerie Roys, MD  oxyCODONE (OXY IR/ROXICODONE) 5 MG immediate release tablet Take 1 tablet (5 mg total) by mouth every 6 (six) hours as needed for severe pain or breakthrough pain. 12/11/19   Shivaji, Valerie Roys, MD  Prenatal Vit-Fe Fumarate-FA (MULTIVITAMIN-PRENATAL) 27-0.8 MG TABS tablet Take 1 tablet by mouth daily at 12 noon.    [provider]  pantoprazole (PROTONIX) 20 MG tablet Take 1 tablet (20 mg total) by mouth daily. 12/07/18 04/09/19  Bethann Berkshire, MD    Allergies    Patient has no known allergies.  Review of Systems   Review  of Systems  Constitutional:  Negative for fever.  HENT:  Negative for sore throat.   Eyes:  Negative for redness.  Respiratory:  Positive for shortness of breath. Negative for cough.   Cardiovascular:  Positive for chest pain. Negative for palpitations and leg swelling.  Gastrointestinal:  Negative for abdominal pain, diarrhea and vomiting.  Genitourinary:  Negative for dysuria, flank pain and hematuria.  Musculoskeletal:  Negative for back pain and neck pain.  Skin:  Negative for rash.  Neurological:  Negative for headaches.  Hematological:  Does not bruise/bleed easily.  Psychiatric/Behavioral:  Negative for confusion.    Physical Exam Updated Vital Signs BP 136/88 (BP Location: Right Arm)   Pulse 90   Temp 98.9 F (37.2 C)   Resp 20   Ht 1.549 m (5\' 1" )   Wt 65.3 kg   LMP 12/10/2020 (Approximate)   SpO2 100%   BMI 27.21 kg/m   Physical Exam Vitals and nursing note reviewed.  Constitutional:      Appearance: Normal appearance. She is well-developed.  HENT:     Head: Atraumatic.     Nose: Nose normal.     Mouth/Throat:     Mouth: Mucous membranes are moist.  Eyes:     General: No scleral icterus.    Conjunctiva/sclera: Conjunctivae normal.  Neck:     Trachea: No tracheal deviation.  Cardiovascular:     Rate and Rhythm: Normal rate and regular rhythm.     Pulses: Normal pulses.     Heart sounds: Normal heart sounds. No murmur heard.   No friction rub. No gallop.  Pulmonary:     Effort: Pulmonary effort is normal. No respiratory distress.     Breath sounds: Normal breath sounds.  Chest:     Chest wall: No tenderness.  Abdominal:     General: Bowel sounds are normal. There is no distension.     Palpations: Abdomen is soft. There is no mass.     Tenderness: There is no abdominal tenderness. There is no guarding.  Genitourinary:    Comments: No cva tenderness.  Musculoskeletal:        General: No swelling or tenderness.     Cervical back: Normal range of motion  and neck supple. No rigidity. No muscular tenderness.     Right lower leg: No edema.     Left lower leg: No edema.     Comments: T/L spine non tender aligned. Left trapezius muscular tenderness.   Skin:    General: Skin is warm and dry.  Findings: No rash.  Neurological:     Mental Status: She is alert.     Comments: Alert, speech normal. Motor/sens grossly intact bil. Steady gait.   Psychiatric:        Mood and Affect: Mood normal.    ED Results / Procedures / Treatments   Labs (all labs ordered are listed, but only abnormal results are displayed) Results for orders placed or performed during the hospital encounter of 12/24/20  Basic metabolic panel  Result Value Ref Range   Sodium 139 135 - 145 mmol/L   Potassium 3.7 3.5 - 5.1 mmol/L   Chloride 102 98 - 111 mmol/L   CO2 26 22 - 32 mmol/L   Glucose, Bld 96 70 - 99 mg/dL   BUN 7 6 - 20 mg/dL   Creatinine, Ser 1.300.56 0.44 - 1.00 mg/dL   Calcium 86.510.1 8.9 - 78.410.3 mg/dL   GFR, Estimated >69>60 >62>60 mL/min   Anion gap 11 5 - 15  CBC  Result Value Ref Range   WBC 10.0 4.0 - 10.5 K/uL   RBC 4.88 3.87 - 5.11 MIL/uL   Hemoglobin 14.5 12.0 - 15.0 g/dL   HCT 95.243.3 84.136.0 - 32.446.0 %   MCV 88.7 80.0 - 100.0 fL   MCH 29.7 26.0 - 34.0 pg   MCHC 33.5 30.0 - 36.0 g/dL   RDW 40.112.1 02.711.5 - 25.315.5 %   Platelets 344 150 - 400 K/uL   nRBC 0.0 0.0 - 0.2 %  Pregnancy, urine  Result Value Ref Range   Preg Test, Ur NEGATIVE NEGATIVE  Troponin I (High Sensitivity)  Result Value Ref Range   Troponin I (High Sensitivity) <2 <18 ng/L   DG Chest 2 View  Result Date: 12/24/2020 CLINICAL DATA:  Left-sided chest pain. EXAM: CHEST - 2 VIEW COMPARISON:  None. FINDINGS: The cardiomediastinal contours are normal. The lungs are clear. Pulmonary vasculature is normal. No consolidation, pleural effusion, or pneumothorax. No acute osseous abnormalities are seen. IMPRESSION: Negative radiographs of the chest. Electronically Signed   By: Narda RutherfordMelanie  Sanford M.D.   On:  12/24/2020 19:38    EKG None  Radiology DG Chest 2 View  Result Date: 12/24/2020 CLINICAL DATA:  Left-sided chest pain. EXAM: CHEST - 2 VIEW COMPARISON:  None. FINDINGS: The cardiomediastinal contours are normal. The lungs are clear. Pulmonary vasculature is normal. No consolidation, pleural effusion, or pneumothorax. No acute osseous abnormalities are seen. IMPRESSION: Negative radiographs of the chest. Electronically Signed   By: Narda RutherfordMelanie  Sanford M.D.   On: 12/24/2020 19:38   CT Angio Chest PE W/Cm &/Or Wo Cm  Result Date: 12/24/2020 CLINICAL DATA:  Left chest pain, evaluate for PE EXAM: CT ANGIOGRAPHY CHEST WITH CONTRAST TECHNIQUE: Multidetector CT imaging of the chest was performed using the standard protocol during bolus administration of intravenous contrast. Multiplanar CT image reconstructions and MIPs were obtained to evaluate the vascular anatomy. CONTRAST:  100mL OMNIPAQUE IOHEXOL 350 MG/ML SOLN COMPARISON:  Chest radiographs dated 12/24/2020 FINDINGS: Cardiovascular: Satisfactory opacification of the bilateral pulmonary arteries to the lobar level. No evidence of pulmonary embolism. Although not tailored for evaluation of the thoracic aorta, there is no evidence of thoracic aortic aneurysm or dissection. The heart is normal in size.  No pericardial effusion. Mediastinum/Nodes: No suspicious mediastinal lymphadenopathy. Visualized thyroid is unremarkable. Lungs/Pleura: Lungs are clear. No suspicious pulmonary nodules. No focal consolidation. No pleural effusion or pneumothorax. Upper Abdomen: Visualized upper abdomen is grossly unremarkable. Musculoskeletal: Visualized osseous structures are within normal limits. Review of  the MIP images confirms the above findings. IMPRESSION: No evidence of pulmonary embolism. Negative CT chest. Electronically Signed   By: Charline Bills M.D.   On: 12/24/2020 22:41    Procedures Procedures   Medications Ordered in ED Medications - No data to  display  ED Course  I have reviewed the triage vital signs and the nursing notes.  Pertinent labs & imaging results that were available during my care of the patient were reviewed by me and considered in my medical decision making (see chart for details).    MDM Rules/Calculators/A&P                           Iv ns. Labs. Cxr.   Reviewed nursing notes and prior charts for additional history.   Labs reviewed/interpreted by me - wbc normal. After symptoms for ~ 12 hrs, constant, trop is normal - felt not c/w acs.   CXR reviewed/interpreted by me - no pna.   Given pleuritic pain, pt/pcp concern for possible PE, will get imaging study.   CT reviewed/interpreted by me - no pe.   Acetaminophen po. Ibuprofen po.   On recheck, patient notes pain also worse w movement, positional changes, sneeze, etc. - w left trapezius pain/tenderness, ?musculoskeletal pain/spasm.   Pt currently appears stable for d/c.     Final Clinical Impression(s) / ED Diagnoses Final diagnoses:  None    Rx / DC Orders ED Discharge Orders     None        Cathren Laine, MD 12/24/20 2247

## 2020-12-24 NOTE — Discharge Instructions (Addendum)
It was our pleasure to provide your ER care today - we hope that you feel better.  Take acetaminophen or ibuprofen as need for pain.  You may also take robaxin as need for muscle pain/spasm - no driving when taking. You may also try heat therapy and gentle massage to sore area left neck/upper back.   Follow up with primary care doctor in one week if symptoms fail to improve/resolve.  Return to ER if worse, new symptoms, fevers, severe or intractable pain, or other concern.

## 2021-02-24 LAB — HEPATITIS C ANTIBODY: HCV Ab: NEGATIVE

## 2021-02-24 LAB — OB RESULTS CONSOLE ABO/RH: RH Type: POSITIVE

## 2021-02-24 LAB — OB RESULTS CONSOLE RPR: RPR: NONREACTIVE

## 2021-02-24 LAB — OB RESULTS CONSOLE HEPATITIS B SURFACE ANTIGEN: Hepatitis B Surface Ag: NEGATIVE

## 2021-02-24 LAB — OB RESULTS CONSOLE HIV ANTIBODY (ROUTINE TESTING): HIV: NONREACTIVE

## 2021-02-24 LAB — OB RESULTS CONSOLE GC/CHLAMYDIA
Chlamydia: NEGATIVE
Gonorrhea: NEGATIVE

## 2021-02-24 LAB — OB RESULTS CONSOLE ANTIBODY SCREEN: Antibody Screen: NEGATIVE

## 2021-02-24 LAB — OB RESULTS CONSOLE RUBELLA ANTIBODY, IGM: Rubella: IMMUNE

## 2021-06-06 NOTE — L&D Delivery Note (Signed)
Patient is eating, ambulating, voiding.  Pain control is good.  Appropriate lochia, no complaints. ? ?Vitals:  ? 09/13/21 1716 09/13/21 2102 09/14/21 0123 09/14/21 0542  ?BP: 133/79 131/74 126/77 119/79  ?Pulse: 75 (!) 57 60 66  ?Resp: 16 18 18 18   ?Temp: 98.1 ?F (36.7 ?C) 98.2 ?F (36.8 ?C) 97.8 ?F (36.6 ?C) 98.1 ?F (36.7 ?C)  ?TempSrc: Oral Oral Oral   ?SpO2: 97% 98% 97% 98%  ?Weight:      ?Height:      ? ? ?Fundus firm ?Inc: c/d/I ?Ext: no calf tenderness ? ?Lab Results  ?Component Value Date  ? WBC 14.4 (H) 09/14/2021  ? HGB 9.0 (L) 09/14/2021  ? HCT 26.4 (L) 09/14/2021  ? MCV 89.8 09/14/2021  ? PLT 178 09/14/2021  ? ? ?--/--/O POS (04/07 04-24-1998) ? ?A/P Post op day #1 s/p repeat C/S ?Doing well, mild acute blood loss anemia, continue PNV with Fe ?Circ desired, reviewed elective nature, risks, potential complications.  Pt wishes to proceed.  Circ performed. ? Routine care.  Expect d/c 4/12.   ? ?6/12 ?  ?

## 2021-08-23 LAB — OB RESULTS CONSOLE GBS: GBS: NEGATIVE

## 2021-08-31 ENCOUNTER — Encounter (HOSPITAL_COMMUNITY): Payer: Self-pay | Admitting: *Deleted

## 2021-08-31 ENCOUNTER — Encounter (HOSPITAL_COMMUNITY): Payer: Self-pay

## 2021-08-31 NOTE — Patient Instructions (Signed)
Alexis Hunt ? 08/31/2021 ? ? Your procedure is scheduled on:  09/13/2021 ? Arrive at [employer] at [facility] on [address] at [facility]  and [employer]. You are invited to use the FREE valet parking or use the Visitor's parking deck. ? Pick up the phone at the desk and dial [phone]. ? Call this number if you have problems the morning of surgery: [phone] ? Remember: ? ? Do not eat food:(After Midnight) Desp?s de medianoche. ? Do not drink clear liquids: (After Midnight) Desp?s de medianoche. ? Take these medicines the morning of surgery with A SIP OF WATER:  none ? ? Do not wear jewelry, make-up or nail polish. ? Do not wear lotions, powders, or perfumes. Do not wear deodorant. ? Do not shave 48 hours prior to surgery. ? Do not bring valuables to the hospital.  [facility] is not  ? responsible for any belongings or valuables brought to the hospital. ? Contacts, dentures or bridgework may not be worn into surgery. ? Leave suitcase in the car. After surgery it may be brought to your room. ? For patients admitted to the hospital, checkout time is 11:00 AM the day of  ?            discharge. ? ?   ? Please read over the following fact sheets that you were given:  ?   Preparing for Surgery ? ? ?

## 2021-09-10 ENCOUNTER — Other Ambulatory Visit: Payer: Self-pay

## 2021-09-10 ENCOUNTER — Other Ambulatory Visit (HOSPITAL_COMMUNITY)
Admission: RE | Admit: 2021-09-10 | Discharge: 2021-09-10 | Disposition: A | Discharge status: Home / Self Care | Payer: Medicaid Other | Source: Ambulatory Visit | Attending: Obstetrics & Gynecology | Admitting: Obstetrics & Gynecology

## 2021-09-10 DIAGNOSIS — O34211 Maternal care for low transverse scar from previous cesarean delivery: Secondary | ICD-10-CM | POA: Diagnosis not present

## 2021-09-10 DIAGNOSIS — Z3A00 Weeks of gestation of pregnancy not specified: Secondary | ICD-10-CM | POA: Diagnosis not present

## 2021-09-10 DIAGNOSIS — Z01812 Encounter for preprocedural laboratory examination: Secondary | ICD-10-CM | POA: Insufficient documentation

## 2021-09-10 HISTORY — DX: Personal history of other complications of pregnancy, childbirth and the puerperium: Z87.59

## 2021-09-10 LAB — CBC
HCT: 36 % (ref 36.0–46.0)
Hemoglobin: 12.1 g/dL (ref 12.0–15.0)
MCH: 30.6 pg (ref 26.0–34.0)
MCHC: 33.6 g/dL (ref 30.0–36.0)
MCV: 90.9 fL (ref 80.0–100.0)
Platelets: 213 10*3/uL (ref 150–400)
RBC: 3.96 MIL/uL (ref 3.87–5.11)
RDW: 13.1 % (ref 11.5–15.5)
WBC: 12.5 10*3/uL — ABNORMAL HIGH (ref 4.0–10.5)
nRBC: 0 % (ref 0.0–0.2)

## 2021-09-10 LAB — RPR: RPR Ser Ql: NONREACTIVE

## 2021-09-10 LAB — TYPE AND SCREEN
ABO/RH(D): O POS
Antibody Screen: NEGATIVE

## 2021-09-12 NOTE — H&P (Signed)
Alexis Hunt is a 26 y.o. female, G2 P1001, EGA [redacted] weeks with EDC 4-15 presenting for elective repeat c-section.  Previous LTCS, CTOL but declines VBAC.  PNC essentially uncomplicated. ? ?OB History   ? ? Gravida  ?2  ? Para  ?1  ? Term  ?1  ? Preterm  ?0  ? AB  ?0  ? Living  ?1  ?  ? ? SAB  ?0  ? IAB  ?0  ? Ectopic  ?0  ? Multiple  ?0  ? Live Births  ?1  ?   ?  ?  ? ?Past Medical History:  ?Diagnosis Date  ? Anxiety   ? Cesarean delivery delivered 12/10/2019  ? Concussion   ? Family history of adverse reaction to anesthesia   ? dad possible seizures  ? History of gestational hypertension   ? Pregnancy induced hypertension   ? ?Past Surgical History:  ?Procedure Laterality Date  ? CESAREAN SECTION N/A 12/08/2019  ? Procedure: CESAREAN SECTION;  Surgeon: Lavina Hamman, MD;  Location: [facility] LD ORS;  Service: Obstetrics;  Laterality: N/A;  ? NO PAST SURGERIES    ? ?Family History: family history includes Healthy in her father and mother; Heart disease in her maternal grandfather; Hyperlipidemia in her maternal grandfather, paternal grandfather, and paternal grandmother; Hypertension in her mother and paternal grandmother; Stroke in her maternal grandfather. ?Social History:  reports that she has been smoking cigarettes. She has been smoking an average of .25 packs per day. She has never used smokeless tobacco. She reports that she does not drink alcohol and does not use drugs. ? ? ?  ?Maternal Diabetes: No ?Genetic Screening: Declined ?Maternal Ultrasounds/Referrals: Normal ?Fetal Ultrasounds or other Referrals:  None ?Maternal Substance Abuse:  No ?Significant Maternal Medications:  None ?Significant Maternal Lab Results:  Group B Strep negative ?Other Comments:  None ? ?Review of Systems  ?Respiratory: Negative.    ?Cardiovascular: Negative.   ?Maternal Medical History:  ?Fetal activity: Perceived fetal activity is normal.   ?Prenatal complications: no prenatal complications ?Prenatal Complications - Diabetes:  none. ? ?  ?Last menstrual period 12/10/2020, unknown if currently breastfeeding. ?Maternal Exam:  ?Abdomen: Patient reports no abdominal tenderness. Estimated fetal weight is 7.5 lbs.   ?Fetal presentation: vertex ?Introitus: Normal vulva. Normal vagina.  Amniotic fluid character: not assessed. ? ?Physical Exam ?Constitutional:   ?   Appearance: Normal appearance.  ?Cardiovascular:  ?   Rate and Rhythm: Normal rate and regular rhythm.  ?   Heart sounds: Normal heart sounds. No murmur heard. ?Pulmonary:  ?   Effort: Pulmonary effort is normal. No respiratory distress.  ?   Breath sounds: Normal breath sounds.  ?Abdominal:  ?   Palpations: Abdomen is soft.  ?   Comments: Keloid at scar  ?Genitourinary: ?   General: Normal vulva.  ?Musculoskeletal:  ?   Cervical back: Normal range of motion and neck supple.  ?Neurological:  ?   Mental Status: She is alert.  ?  ?Prenatal labs: ?ABO, Rh: --/--/O POS (04/07 9201) ?Antibody: NEG (04/07 0071) ?Rubella: Immune (09/21 0000) ?RPR: NON REACTIVE (04/07 0925)  ?HBsAg: Negative (09/21 0000)  ?HIV: Non-reactive (09/21 0000)  ?GBS: Negative/-- (03/20 0000)  ? ?Assessment/Plan: ?IUP at 39 weeks, previous c-section, keloid.  She declines TOLAC, will admit for repeat c-section and scar revision  ? ?Alexis Hunt ?09/12/2021, 10:56 AM ? ? ? ? ?

## 2021-09-13 ENCOUNTER — Encounter (HOSPITAL_COMMUNITY): Payer: Self-pay | Admitting: Obstetrics and Gynecology

## 2021-09-13 ENCOUNTER — Inpatient Hospital Stay (HOSPITAL_COMMUNITY): Payer: Medicaid Other

## 2021-09-13 ENCOUNTER — Other Ambulatory Visit: Payer: Self-pay

## 2021-09-13 ENCOUNTER — Encounter (HOSPITAL_COMMUNITY)
Admission: RE | Disposition: A | Discharge status: Home / Self Care | Payer: Self-pay | Source: Home / Self Care | Attending: Obstetrics and Gynecology

## 2021-09-13 ENCOUNTER — Inpatient Hospital Stay (HOSPITAL_COMMUNITY)
Admission: RE | Admit: 2021-09-13 | Discharge: 2021-09-15 | DRG: 787 | Disposition: A | Discharge status: Home / Self Care | Payer: Medicaid Other | Attending: Obstetrics and Gynecology | Admitting: Obstetrics and Gynecology

## 2021-09-13 DIAGNOSIS — O34211 Maternal care for low transverse scar from previous cesarean delivery: Secondary | ICD-10-CM

## 2021-09-13 DIAGNOSIS — O99334 Smoking (tobacco) complicating childbirth: Secondary | ICD-10-CM | POA: Diagnosis present

## 2021-09-13 DIAGNOSIS — F17210 Nicotine dependence, cigarettes, uncomplicated: Secondary | ICD-10-CM | POA: Diagnosis present

## 2021-09-13 DIAGNOSIS — Z23 Encounter for immunization: Secondary | ICD-10-CM

## 2021-09-13 DIAGNOSIS — O9081 Anemia of the puerperium: Secondary | ICD-10-CM | POA: Diagnosis not present

## 2021-09-13 DIAGNOSIS — O9972 Diseases of the skin and subcutaneous tissue complicating childbirth: Secondary | ICD-10-CM | POA: Diagnosis present

## 2021-09-13 DIAGNOSIS — L910 Hypertrophic scar: Secondary | ICD-10-CM | POA: Diagnosis present

## 2021-09-13 DIAGNOSIS — Z98891 History of uterine scar from previous surgery: Secondary | ICD-10-CM

## 2021-09-13 DIAGNOSIS — O9902 Anemia complicating childbirth: Secondary | ICD-10-CM | POA: Diagnosis present

## 2021-09-13 DIAGNOSIS — O164 Unspecified maternal hypertension, complicating childbirth: Secondary | ICD-10-CM | POA: Diagnosis not present

## 2021-09-13 DIAGNOSIS — Z3A39 39 weeks gestation of pregnancy: Secondary | ICD-10-CM | POA: Diagnosis not present

## 2021-09-13 DIAGNOSIS — D62 Acute posthemorrhagic anemia: Secondary | ICD-10-CM | POA: Diagnosis not present

## 2021-09-13 SURGERY — Surgical Case
Anesthesia: Spinal | Site: Abdomen | Wound class: Clean Contaminated

## 2021-09-13 MED ORDER — COCONUT OIL OIL: 1.0000 "application " | TOPICAL_OIL | Status: DC | PRN

## 2021-09-13 MED ORDER — DIPHENHYDRAMINE HCL 50 MG/ML IJ SOLN: 12.5000 mg | INTRAMUSCULAR | Status: DC | PRN

## 2021-09-13 MED ORDER — SODIUM CHLORIDE 0.9% FLUSH
3.0000 mL | INTRAVENOUS | Status: DC | PRN
Start: 2021-09-13 — End: 2021-09-15

## 2021-09-13 MED ORDER — MORPHINE SULFATE (PF) 0.5 MG/ML IJ SOLN
INTRAMUSCULAR | Status: DC | PRN
  Administered 2021-09-13: 150 ug via INTRATHECAL

## 2021-09-13 MED ORDER — DIPHENHYDRAMINE HCL 25 MG PO CAPS: 25.0000 mg | ORAL_CAPSULE | Freq: Four times a day (QID) | ORAL | Status: DC | PRN

## 2021-09-13 MED ORDER — KETOROLAC TROMETHAMINE 30 MG/ML IJ SOLN
30.0000 mg | Freq: Four times a day (QID) | INTRAMUSCULAR | Status: DC | PRN
  Administered 2021-09-13: 30 mg via INTRAVENOUS

## 2021-09-13 MED ORDER — FENTANYL CITRATE (PF) 100 MCG/2ML IJ SOLN: 25.0000 ug | INTRAMUSCULAR | Status: DC | PRN

## 2021-09-13 MED ORDER — METHYLERGONOVINE MALEATE 0.2 MG PO TABS: 0.2000 mg | ORAL_TABLET | ORAL | Status: DC | PRN

## 2021-09-13 MED ORDER — ACETAMINOPHEN 500 MG PO TABS
1000.0000 mg | ORAL_TABLET | Freq: Four times a day (QID) | ORAL | Status: DC
  Administered 2021-09-13: 1000 mg via ORAL

## 2021-09-13 MED ORDER — ZOLPIDEM TARTRATE 5 MG PO TABS: 5.0000 mg | ORAL_TABLET | Freq: Every evening | ORAL | Status: DC | PRN

## 2021-09-13 MED ORDER — PHENYLEPHRINE HCL-NACL 20-0.9 MG/250ML-% IV SOLN
INTRAVENOUS | Status: DC | PRN
  Administered 2021-09-13: 20 ug/min via INTRAVENOUS

## 2021-09-13 MED ORDER — OXYCODONE HCL 5 MG/5ML PO SOLN: 5.0000 mg | Freq: Once | ORAL | Status: DC | PRN

## 2021-09-13 MED ORDER — OXYTOCIN-SODIUM CHLORIDE 30-0.9 UT/500ML-% IV SOLN: 2.5000 [IU]/h | INTRAVENOUS | Status: DC

## 2021-09-13 MED ORDER — SIMETHICONE 80 MG PO CHEW
80.0000 mg | CHEWABLE_TABLET | Freq: Three times a day (TID) | ORAL | Status: DC
  Administered 2021-09-13 – 2021-09-15 (×5): 80 mg via ORAL
  Filled 2021-09-13 (×5): qty 1

## 2021-09-13 MED ORDER — KETOROLAC TROMETHAMINE 30 MG/ML IJ SOLN: 30.0000 mg | Freq: Four times a day (QID) | INTRAMUSCULAR | Status: DC

## 2021-09-13 MED ORDER — OXYCODONE HCL 5 MG PO TABS: 5.0000 mg | ORAL_TABLET | ORAL | Status: DC | PRN

## 2021-09-13 MED ORDER — SENNOSIDES-DOCUSATE SODIUM 8.6-50 MG PO TABS: 2.0000 | ORAL_TABLET | ORAL | Status: DC

## 2021-09-13 MED ORDER — FENTANYL CITRATE (PF) 100 MCG/2ML IJ SOLN
INTRAMUSCULAR | Status: DC | PRN
  Administered 2021-09-13: 15 ug via INTRATHECAL

## 2021-09-13 MED ORDER — TETANUS-DIPHTH-ACELL PERTUSSIS 5-2.5-18.5 LF-MCG/0.5 IM SUSY: 0.5000 mL | PREFILLED_SYRINGE | Freq: Once | INTRAMUSCULAR | Status: DC

## 2021-09-13 MED ORDER — KETOROLAC TROMETHAMINE 30 MG/ML IJ SOLN
INTRAMUSCULAR | Status: AC
  Filled 2021-09-13: qty 1

## 2021-09-13 MED ORDER — SENNOSIDES-DOCUSATE SODIUM 8.6-50 MG PO TABS
2.0000 | ORAL_TABLET | Freq: Every day | ORAL | Status: DC
  Administered 2021-09-14 – 2021-09-15 (×2): 2 via ORAL
  Filled 2021-09-13 (×2): qty 2

## 2021-09-13 MED ORDER — DEXAMETHASONE SODIUM PHOSPHATE 10 MG/ML IJ SOLN
INTRAMUSCULAR | Status: AC
  Filled 2021-09-13: qty 1

## 2021-09-13 MED ORDER — METHYLERGONOVINE MALEATE 0.2 MG/ML IJ SOLN: 0.2000 mg | INTRAMUSCULAR | Status: DC | PRN

## 2021-09-13 MED ORDER — CEFAZOLIN SODIUM-DEXTROSE 2-4 GM/100ML-% IV SOLN
INTRAVENOUS | Status: AC
  Filled 2021-09-13: qty 100

## 2021-09-13 MED ORDER — DEXAMETHASONE SODIUM PHOSPHATE 10 MG/ML IJ SOLN
INTRAMUSCULAR | Status: DC | PRN
Start: 2021-09-13 — End: 2021-09-13
  Administered 2021-09-13: 10 mg via INTRAVENOUS

## 2021-09-13 MED ORDER — MEASLES, MUMPS & RUBELLA VAC IJ SOLR: 0.5000 mL | Freq: Once | INTRAMUSCULAR | Status: DC

## 2021-09-13 MED ORDER — SODIUM CHLORIDE 0.9 % IR SOLN
Status: DC | PRN
  Administered 2021-09-13: 1

## 2021-09-13 MED ORDER — KETOROLAC TROMETHAMINE 30 MG/ML IJ SOLN: 30.0000 mg | Freq: Four times a day (QID) | INTRAMUSCULAR | Status: DC | PRN

## 2021-09-13 MED ORDER — POVIDONE-IODINE 10 % EX SWAB
2.0000 "application " | Freq: Once | CUTANEOUS | Status: AC
  Administered 2021-09-13: 2 via TOPICAL

## 2021-09-13 MED ORDER — MAGNESIUM HYDROXIDE 400 MG/5ML PO SUSP: 30.0000 mL | ORAL | Status: DC | PRN

## 2021-09-13 MED ORDER — MENTHOL 3 MG MT LOZG: 1.0000 | LOZENGE | OROMUCOSAL | Status: DC | PRN

## 2021-09-13 MED ORDER — ONDANSETRON HCL 4 MG/2ML IJ SOLN
INTRAMUSCULAR | Status: AC
  Filled 2021-09-13: qty 2

## 2021-09-13 MED ORDER — PRENATAL MULTIVITAMIN CH: 1.0000 | ORAL_TABLET | Freq: Every day | ORAL | Status: DC

## 2021-09-13 MED ORDER — STERILE WATER FOR IRRIGATION IR SOLN
Status: DC | PRN
Start: 2021-09-13 — End: 2021-09-13
  Administered 2021-09-13: 1

## 2021-09-13 MED ORDER — ORAL CARE MOUTH RINSE: 15.0000 mL | Freq: Once | OROMUCOSAL | Status: DC

## 2021-09-13 MED ORDER — ACETAMINOPHEN 500 MG PO TABS
1000.0000 mg | ORAL_TABLET | Freq: Four times a day (QID) | ORAL | Status: DC
  Administered 2021-09-13 – 2021-09-15 (×6): 1000 mg via ORAL
  Filled 2021-09-13 (×6): qty 2

## 2021-09-13 MED ORDER — OXYTOCIN-SODIUM CHLORIDE 30-0.9 UT/500ML-% IV SOLN
INTRAVENOUS | Status: AC
  Filled 2021-09-13: qty 500

## 2021-09-13 MED ORDER — DIBUCAINE (PERIANAL) 1 % EX OINT: 1.0000 "application " | TOPICAL_OINTMENT | CUTANEOUS | Status: DC | PRN

## 2021-09-13 MED ORDER — ONDANSETRON HCL 4 MG/2ML IJ SOLN
INTRAMUSCULAR | Status: DC | PRN
Start: 2021-09-13 — End: 2021-09-13
  Administered 2021-09-13: 4 mg via INTRAVENOUS

## 2021-09-13 MED ORDER — NALOXONE HCL 4 MG/10ML IJ SOLN
1.0000 ug/kg/h | INTRAVENOUS | Status: DC | PRN
  Filled 2021-09-13: qty 5

## 2021-09-13 MED ORDER — MORPHINE SULFATE (PF) 0.5 MG/ML IJ SOLN
INTRAMUSCULAR | Status: AC
  Filled 2021-09-13: qty 10

## 2021-09-13 MED ORDER — LACTATED RINGERS IV SOLN: INTRAVENOUS | Status: DC

## 2021-09-13 MED ORDER — IBUPROFEN 600 MG PO TABS
600.0000 mg | ORAL_TABLET | Freq: Four times a day (QID) | ORAL | Status: DC
  Administered 2021-09-13: 600 mg via ORAL
  Filled 2021-09-13: qty 1

## 2021-09-13 MED ORDER — LACTATED RINGERS IV SOLN
INTRAVENOUS | Status: DC
  Administered 2021-09-13: 125 mL/h via INTRAVENOUS

## 2021-09-13 MED ORDER — TETANUS-DIPHTH-ACELL PERTUSSIS 5-2.5-18.5 LF-MCG/0.5 IM SUSY
0.5000 mL | PREFILLED_SYRINGE | Freq: Once | INTRAMUSCULAR | Status: AC
  Administered 2021-09-14: 0.5 mL via INTRAMUSCULAR
  Filled 2021-09-13: qty 0.5

## 2021-09-13 MED ORDER — SCOPOLAMINE 1 MG/3DAYS TD PT72: 1.0000 | MEDICATED_PATCH | Freq: Once | TRANSDERMAL | Status: DC

## 2021-09-13 MED ORDER — ACETAMINOPHEN 500 MG PO TABS
1000.0000 mg | ORAL_TABLET | Freq: Four times a day (QID) | ORAL | Status: DC
  Filled 2021-09-13: qty 2

## 2021-09-13 MED ORDER — ONDANSETRON HCL 4 MG/2ML IJ SOLN: 4.0000 mg | Freq: Once | INTRAMUSCULAR | Status: DC | PRN

## 2021-09-13 MED ORDER — OXYCODONE HCL 5 MG PO TABS: 5.0000 mg | ORAL_TABLET | Freq: Once | ORAL | Status: DC | PRN

## 2021-09-13 MED ORDER — NALOXONE HCL 0.4 MG/ML IJ SOLN: 0.4000 mg | INTRAMUSCULAR | Status: DC | PRN

## 2021-09-13 MED ORDER — WITCH HAZEL-GLYCERIN EX PADS: 1.0000 "application " | MEDICATED_PAD | CUTANEOUS | Status: DC | PRN

## 2021-09-13 MED ORDER — PRENATAL MULTIVITAMIN CH
1.0000 | ORAL_TABLET | Freq: Every day | ORAL | Status: DC
  Administered 2021-09-13 – 2021-09-14 (×2): 1 via ORAL
  Filled 2021-09-13 (×2): qty 1

## 2021-09-13 MED ORDER — IBUPROFEN 600 MG PO TABS
600.0000 mg | ORAL_TABLET | Freq: Four times a day (QID) | ORAL | Status: DC
Start: 2021-09-13 — End: 2021-09-15
  Administered 2021-09-13 – 2021-09-15 (×7): 600 mg via ORAL
  Filled 2021-09-13 (×7): qty 1

## 2021-09-13 MED ORDER — SIMETHICONE 80 MG PO CHEW: 80.0000 mg | CHEWABLE_TABLET | ORAL | Status: DC | PRN

## 2021-09-13 MED ORDER — PHENYLEPHRINE HCL-NACL 20-0.9 MG/250ML-% IV SOLN
INTRAVENOUS | Status: AC
  Filled 2021-09-13: qty 250

## 2021-09-13 MED ORDER — OXYCODONE HCL 5 MG PO TABS
5.0000 mg | ORAL_TABLET | ORAL | Status: DC | PRN
  Administered 2021-09-14: 10 mg via ORAL
  Administered 2021-09-14 – 2021-09-15 (×4): 5 mg via ORAL
  Filled 2021-09-13: qty 1
  Filled 2021-09-13: qty 2
  Filled 2021-09-13 (×3): qty 1

## 2021-09-13 MED ORDER — AMISULPRIDE (ANTIEMETIC) 5 MG/2ML IV SOLN
10.0000 mg | Freq: Once | INTRAVENOUS | Status: DC | PRN
  Filled 2021-09-13: qty 4

## 2021-09-13 MED ORDER — IBUPROFEN 600 MG PO TABS: 600.0000 mg | ORAL_TABLET | Freq: Four times a day (QID) | ORAL | Status: DC

## 2021-09-13 MED ORDER — CEFAZOLIN SODIUM-DEXTROSE 2-4 GM/100ML-% IV SOLN
2.0000 g | INTRAVENOUS | Status: AC
  Administered 2021-09-13: 2 g via INTRAVENOUS

## 2021-09-13 MED ORDER — CHLORHEXIDINE GLUCONATE 0.12 % MT SOLN
15.0000 mL | Freq: Once | OROMUCOSAL | Status: DC
  Filled 2021-09-13: qty 15

## 2021-09-13 MED ORDER — SOD CITRATE-CITRIC ACID 500-334 MG/5ML PO SOLN: 30.0000 mL | Freq: Once | ORAL | Status: DC

## 2021-09-13 MED ORDER — BUPIVACAINE IN DEXTROSE 0.75-8.25 % IT SOLN
INTRATHECAL | Status: DC | PRN
  Administered 2021-09-13: 1.6 mL via INTRATHECAL

## 2021-09-13 MED ORDER — FENTANYL CITRATE (PF) 100 MCG/2ML IJ SOLN
INTRAMUSCULAR | Status: AC
  Filled 2021-09-13: qty 2

## 2021-09-13 MED ORDER — ONDANSETRON HCL 4 MG/2ML IJ SOLN: 4.0000 mg | Freq: Three times a day (TID) | INTRAMUSCULAR | Status: DC | PRN

## 2021-09-13 MED ORDER — MEPERIDINE HCL 25 MG/ML IJ SOLN: 6.2500 mg | INTRAMUSCULAR | Status: DC | PRN

## 2021-09-13 MED ORDER — OXYTOCIN-SODIUM CHLORIDE 30-0.9 UT/500ML-% IV SOLN
INTRAVENOUS | Status: DC | PRN
Start: 2021-09-13 — End: 2021-09-13
  Administered 2021-09-13 (×2): 100 mL via INTRAVENOUS

## 2021-09-13 MED ORDER — DIPHENHYDRAMINE HCL 25 MG PO CAPS: 25.0000 mg | ORAL_CAPSULE | ORAL | Status: DC | PRN

## 2021-09-13 SURGICAL SUPPLY — 33 items
APL SKNCLS STERI-STRIP NONHPOA (GAUZE/BANDAGES/DRESSINGS) ×1
BENZOIN TINCTURE PRP APPL 2/3 (GAUZE/BANDAGES/DRESSINGS) ×1 IMPLANT
CHLORAPREP W/TINT 26ML (MISCELLANEOUS) ×2 IMPLANT
CLAMP CORD UMBIL (MISCELLANEOUS) IMPLANT
CLOTH BEACON ORANGE TIMEOUT ST (SAFETY) ×2 IMPLANT
DRSG OPSITE POSTOP 4X10 (GAUZE/BANDAGES/DRESSINGS) ×2 IMPLANT
ELECT REM PT RETURN 9FT ADLT (ELECTROSURGICAL) ×2
ELECTRODE REM PT RTRN 9FT ADLT (ELECTROSURGICAL) ×1 IMPLANT
EXTRACTOR VACUUM KIWI (MISCELLANEOUS) IMPLANT
EXTRACTOR VACUUM M CUP 4 TUBE (SUCTIONS) IMPLANT
GLOVE BIOGEL PI IND STRL 7.0 (GLOVE) ×1 IMPLANT
GLOVE BIOGEL PI INDICATOR 7.0 (GLOVE) ×1
GLOVE ORTHO TXT STRL SZ7.5 (GLOVE) ×2 IMPLANT
GOWN STRL REUS W/TWL LRG LVL3 (GOWN DISPOSABLE) ×4 IMPLANT
KIT ABG SYR 3ML LUER SLIP (SYRINGE) IMPLANT
NDL HYPO 25X5/8 SAFETYGLIDE (NEEDLE) ×1 IMPLANT
NEEDLE HYPO 25X5/8 SAFETYGLIDE (NEEDLE) ×2 IMPLANT
NS IRRIG 1000ML POUR BTL (IV SOLUTION) ×2 IMPLANT
PACK C SECTION WH (CUSTOM PROCEDURE TRAY) ×2 IMPLANT
PAD OB MATERNITY 4.3X12.25 (PERSONAL CARE ITEMS) ×2 IMPLANT
PENCIL SMOKE EVAC W/HOLSTER (ELECTROSURGICAL) ×2 IMPLANT
RTRCTR C-SECT PINK 25CM LRG (MISCELLANEOUS) ×2 IMPLANT
STRIP CLOSURE SKIN 1/2X4 (GAUZE/BANDAGES/DRESSINGS) ×1 IMPLANT
SUT CHROMIC 1 CTX 36 (SUTURE) ×4 IMPLANT
SUT PLAIN 0 NONE (SUTURE) IMPLANT
SUT PLAIN 2 0 XLH (SUTURE) IMPLANT
SUT VIC AB 0 CT1 27 (SUTURE) ×4
SUT VIC AB 0 CT1 27XBRD ANBCTR (SUTURE) ×2 IMPLANT
SUT VIC AB 2-0 CT1 (SUTURE) ×2 IMPLANT
SUT VIC AB 4-0 KS 27 (SUTURE) IMPLANT
TOWEL OR 17X24 6PK STRL BLUE (TOWEL DISPOSABLE) ×2 IMPLANT
TRAY FOLEY W/BAG SLVR 14FR LF (SET/KITS/TRAYS/PACK) ×2 IMPLANT
WATER STERILE IRR 1000ML POUR (IV SOLUTION) ×2 IMPLANT

## 2021-09-13 NOTE — Interval H&P Note (Signed)
History and Physical Interval Note: ? ?09/13/2021 ?7:17 AM ? ?Alexis Hunt  has presented today for surgery, with the diagnosis of repeat c-section.  The various methods of treatment have been discussed with the patient and family. After consideration of risks, benefits and other options for treatment, the patient has consented to  Procedure(s): ?CESAREAN SECTION (N/A) as a surgical intervention.  The patient's history has been reviewed, patient examined, no change in status, stable for surgery.  I have reviewed the patient's chart and labs.  Questions were answered to the patient's satisfaction.   ? ? ?Leighton Roach Zareya Tuckett ? ? ?

## 2021-09-13 NOTE — Anesthesia Procedure Notes (Signed)
Spinal ? ?Patient location during procedure: OR ?Start time: 09/13/2021 7:31 AM ?End time: 09/13/2021 7:37 AM ?Reason for block: surgical anesthesia ?Staffing ?Performed: anesthesiologist  ?Anesthesiologist: Mellody Dance, MD ?Preanesthetic Checklist ?Completed: patient identified, IV checked, risks and benefits discussed, surgical consent, monitors and equipment checked, pre-op evaluation and timeout performed ?Spinal Block ?Patient position: sitting ?Prep: DuraPrep ?Patient monitoring: cardiac monitor, continuous pulse ox and blood pressure ?Approach: midline ?Location: L3-4 ?Injection technique: single-shot ?Needle ?Needle type: Pencan  ?Needle gauge: 24 G ?Needle length: 9 cm ?Assessment ?Events: CSF return ?Additional Notes ?Functioning IV was confirmed and monitors were applied. Sterile prep and drape, including hand hygiene and sterile gloves were used. The patient was positioned and the spine was prepped. The skin was anesthetized with lidocaine.  Free flow of clear CSF was obtained prior to injecting local anesthetic into the CSF.  The spinal needle aspirated freely following injection.  The needle was carefully withdrawn.  The patient tolerated the procedure well.  ? ? ? ?

## 2021-09-13 NOTE — Anesthesia Preprocedure Evaluation (Addendum)
Anesthesia Evaluation  ?Patient identified by MRN, date of birth, ID band ?Patient awake ? ? ? ?Reviewed: ?Allergy & Precautions, NPO status , Patient's Chart, lab work & pertinent test results ? ?Airway ?Mallampati: II ? ?TM Distance: >3 FB ?Neck ROM: Full ? ? ? Dental ?no notable dental hx. ? ?  ?Pulmonary ?neg pulmonary ROS, Current Smoker and Patient abstained from smoking.,  ?  ?Pulmonary exam normal ?breath sounds clear to auscultation ? ? ? ? ? ? Cardiovascular ?hypertension, Normal cardiovascular exam ?Rhythm:Regular Rate:Normal ? ? ?  ?Neuro/Psych ?Anxiety Very high anxiety, on no medsnegative neurological ROS ?   ? GI/Hepatic ?negative GI ROS, Neg liver ROS,   ?Endo/Other  ?negative endocrine ROS ? Renal/GU ?negative Renal ROS  ?negative genitourinary ?  ?Musculoskeletal ?negative musculoskeletal ROS ?(+)  ? Abdominal ?  ?Peds ?negative pediatric ROS ?(+)  Hematology ?negative hematology ROS ?(+)   ?Anesthesia Other Findings ? ? Reproductive/Obstetrics ?(+) Pregnancy ? ?  ? ? ? ? ? ? ? ? ? ? ? ? ? ?  ?  ? ? ? ? ? ? ? ?Anesthesia Physical ?Anesthesia Plan ? ?ASA: 2 ? ?Anesthesia Plan: Spinal  ? ?Post-op Pain Management: Minimal or no pain anticipated  ? ?Induction:  ? ?PONV Risk Score and Plan: 1 and Treatment may vary due to age or medical condition, Ondansetron and Dexamethasone ? ?Airway Management Planned: Natural Airway and Simple Face Mask ? ?Additional Equipment: None ? ?Intra-op Plan:  ? ?Post-operative Plan: Extubation in OR ? ?Informed Consent: I have reviewed the patients History and Physical, chart, labs and discussed the procedure including the risks, benefits and alternatives for the proposed anesthesia with the patient or authorized representative who has indicated his/her understanding and acceptance.  ? ? ? ?Dental advisory given ? ?Plan Discussed with: CRNA and Anesthesiologist ? ?Anesthesia Plan Comments:   ? ? ? ? ? ? ?Anesthesia Quick Evaluation ? ?

## 2021-09-13 NOTE — Op Note (Signed)
Preoperative diagnosis: Intrauterine pregnancy at 39 weeks, previous c-section, keloid ?Postoperative diagnosis: Same ?Procedure: Repeat low transverse cesarean section without extensions, scar revision ?Surgeon: Lavina Hamman M.D. ?Anesthesia: Spinal  ?Findings: Patient had normal gravid anatomy and delivered a viable female infant with Apgars of 8 and 9 weight pending ?Estimated blood loss: 250 cc ?Specimens: Placenta sent to labor and delivery ?Complications: None ? ?Procedure in detail: ?The patient was taken to the operating room and placed in the sitting position. The anesthesiologist instilled spinal anesthesia.  She was then placed in the dorsosupine position with left tilt. Abdomen was then prepped and draped in the usual sterile fashion, and a foley catheter was inserted. The level of her anesthesia was found to be adequate. Abdomen was entered via a standard Pfannenstiel incision with the skin incision at the superior edge of her previous scar with keloid. Once the peritoneal cavity was entered the Alexis disposable self-retaining retractor was placed and good visualization was achieved. Bladder flap adhesions were taken down sharply and bladder mobilized inferiorly. A 4 cm transverse incision was then made in the lower uterine segment pushing the bladder inferior. Once the uterine cavity was entered the incision was extended digitally, clear amniotic fluid. The fetal vertex was grasped and delivered through the incision atraumatically. Mouth and nares were suctioned. The remainder of the infant then delivered atraumatically. Cord was doubly clamped and cut after one minute and the infant handed to the awaiting pediatric team. Cord blood was obtained. The placenta delivered spontaneously. Uterus was wiped dry with clean lap pad and all clots and debris were removed. Uterine incision was inspected and found to be free of extensions. Uterine incision was closed in 1 layer with running locking #1 Chromic.  Bleeding from right angle controlled with one additional figure 8 with #1 Chromic.  Tubes and ovaries were inspected and found to be normal. Uterine incision was inspected and found to be hemostatic. Bleeding from serosal edges was controlled with electrocautery. The Alexis retractor was removed. Subfascial space was irrigated and made hemostatic with electrocautery.  Fascia was closed in running fashion starting at both ends and meeting in the middle with 0 Vicryl. Subcutaneous tissue was then irrigated and made hemostatic with electrocautery, then closed with running 2-0 plain gut. Her keloid scar was the removed sharply and with Bovie. Skin was closed with running 4-0 Vicryl subcuticular suture followed by steri-strips and a sterile dressing. Patient tolerated the procedure well and was taken to the recovery in stable condition. Counts were correct x2, she received Ancef 2 g IV at the beginning of the procedure and she had PAS hose on throughout the procedure.  ?

## 2021-09-13 NOTE — Lactation Note (Signed)
This note was copied from a baby's chart. ?Lactation Consultation Note ? ?Patient Name: Alexis Hunt ?Today's Date: 09/13/2021 ?Reason for consult: Initial assessment;Mother's request;Term;Breastfeeding assistance;Other (Comment) (PIH) ?Age:26 years ? ?Infant latched with signs of milk transfer. Mom denied pain with latching.  ?Plan 1 to feed based on cues 8-12x 24 hr period.  ?2. Mom to offer breasts with signs of milk transfer.  ?3. If unable to latch, Mom to give colostrum on spoon with hand expression 5-7 ml ?4. I and O sheet reviewed.  ? ?Mom has [employer] pump at home.  ? ?Maternal Data ?Has patient been taught Hand Expression?: Yes ?Does the patient have breastfeeding experience prior to this delivery?: Yes ?How long did the patient breastfeed?: 2 months pumping and bottle feeding her breast milk ? ?Feeding ?Mother's Current Feeding Choice: Breast Milk ? ?LATCH Score ?Latch: Repeated attempts needed to sustain latch, nipple held in mouth throughout feeding, stimulation needed to elicit sucking reflex. ? ?Audible Swallowing: Spontaneous and intermittent ? ?Type of Nipple: Everted at rest and after stimulation ? ?Comfort (Breast/Nipple): Soft / non-tender ? ?Hold (Positioning): Assistance needed to correctly position infant at breast and maintain latch. ? ?LATCH Score: 8 ? ? ?Lactation Tools Discussed/Used ?Tools: Flanges;Pump ?Flange Size: 24 ?Breast pump type: Manual (Mom wanted to access her flange size. We used 24 flange pre pump 5-10 min before latching. Mom states comfortable fit. Infant can latch without pre pumping as well.) ? ?Interventions ?Interventions: Breast feeding basics reviewed;Assisted with latch;Skin to skin;Breast massage;Hand express;Breast compression;Adjust position;Support pillows;Position options;Expressed milk;Hand pump;Education;LC Magazine features editor;Infant Driven Feeding Algorithm education ? ?Discharge ?Pump: Manual;Personal ?WIC Program: Yes ? ?Consult Status ?Consult Status:  Follow-up ?Date: 09/14/21 ?Follow-up type: In-patient ? ? ? ?Alexis Vanzandt  Hunt ?09/13/2021, 1:37 PM ? ? ? ?

## 2021-09-13 NOTE — Anesthesia Postprocedure Evaluation (Signed)
Anesthesia Post Note ? ?Patient: Alexis Hunt ? ?Procedure(s) Performed: CESAREAN SECTION (Abdomen) ? ?  ? ?Patient location during evaluation: PACU ?Anesthesia Type: Spinal ?Level of consciousness: oriented and awake and alert ?Pain management: pain level controlled ?Vital Signs Assessment: post-procedure vital signs reviewed and stable ?Respiratory status: spontaneous breathing and respiratory function stable ?Cardiovascular status: blood pressure returned to baseline and stable ?Postop Assessment: no headache, no backache, no apparent nausea or vomiting and spinal receding ?Anesthetic complications: no ? ? ?No notable events documented. ? ?Last Vitals:  ?Vitals:  ? 09/13/21 1025 09/13/21 1128  ?BP: 132/85 135/80  ?Pulse: 64 64  ?Resp: 18 18  ?Temp: 37.1 ?C 37 ?C  ?SpO2: 98% 97%  ?  ?Last Pain:  ?Vitals:  ? 09/13/21 1128  ?TempSrc: Oral  ?PainSc: 0-No pain  ? ?Pain Goal:   ? ?  ?  ?  ?  ?  ?  ?Epidural/Spinal Function Cutaneous sensation: Able to Wiggle Toes (09/13/21 1128), Patient able to flex knees: Yes (09/13/21 1128), Patient able to lift hips off bed: Yes (09/13/21 1128), Back pain beyond tenderness at insertion site: No (09/13/21 1128), Progressively worsening motor and/or sensory loss: No (09/13/21 1128), Bowel and/or bladder incontinence post epidural: No (09/13/21 1128) ? ?Mellody Dance ? ? ? ? ?

## 2021-09-13 NOTE — Transfer of Care (Signed)
Immediate Anesthesia Transfer of Care Note ? ?Patient: Alexis Hunt ? ?Procedure(s) Performed: CESAREAN SECTION (Abdomen) ? ?Patient Location: PACU ? ?Anesthesia Type:Spinal ? ?Level of Consciousness: awake ? ?Airway & Oxygen Therapy: Patient Spontanous Breathing ? ?Post-op Assessment: Report given to RN ? ?Post vital signs: Reviewed and stable ? ?Last Vitals:  ?Vitals Value Taken Time  ?BP 125/75 09/13/21 0849  ?Temp    ?Pulse 79 09/13/21 0851  ?Resp 14 09/13/21 0851  ?SpO2 99 % 09/13/21 0851  ?Vitals shown include unvalidated device data. ? ?Last Pain:  ?Vitals:  ? 09/13/21 0552  ?TempSrc: Oral  ?PainSc: 0-No pain  ?   ? ?  ? ?Complications: No notable events documented. ?

## 2021-09-13 NOTE — Progress Notes (Signed)
Patient feels anxious about having to get out of bed. Reassured patient,  ?

## 2021-09-14 LAB — CBC
HCT: 26.4 % — ABNORMAL LOW (ref 36.0–46.0)
Hemoglobin: 9 g/dL — ABNORMAL LOW (ref 12.0–15.0)
MCH: 30.6 pg (ref 26.0–34.0)
MCHC: 34.1 g/dL (ref 30.0–36.0)
MCV: 89.8 fL (ref 80.0–100.0)
Platelets: 178 10*3/uL (ref 150–400)
RBC: 2.94 MIL/uL — ABNORMAL LOW (ref 3.87–5.11)
RDW: 13.1 % (ref 11.5–15.5)
WBC: 14.4 10*3/uL — ABNORMAL HIGH (ref 4.0–10.5)
nRBC: 0 % (ref 0.0–0.2)

## 2021-09-14 LAB — BIRTH TISSUE RECOVERY COLLECTION (PLACENTA DONATION)

## 2021-09-14 NOTE — Social Work (Addendum)
CSW received consult for hx of Anxiety.  CSW met with MOB to offer support and complete assessment.   ? ?CSW with MOB at bedside and introduced CSW role. CSW observed MOB in the bed, infant in CN for circumcision procedure and FOB at bedside. CSW offered MOB privacy. MOB gave CSW permission to share information in front of FOB. CSW inquired how MOB has felt since giving birth. MOB reported feeling okay and shared that the delivery went better in comparison to the previous birth because she felt a lot calmer. CSW inquired how MOB felt emotionally during the pregnancy. MOB reported overall she felt good but felt frustrated at times and had normal stressors. CSW inquired about MOB anxiety. MOB reported that was diagnosed with anxiety as a child. MOB reported she managed her symptoms with therapy and medication in the past she is not interested in either at this time. MOB reported so often she gets a "since of feeling anxious" and may have " a panic attack once in a while." CSW inquired about MOB coping skills. MOB reported that being with her daughter helps distract anxious thoughts and she continues to use breathing techniques she learned while in therapy. CSW acknowledged MOB efforts.  CSW discussed PPD. MOB shared she experienced PPD with her older child. MOB reported she isolated herself from others, she felt anxious when others wanted to hold the infant, she had a vivid dream about a nurse dropping the infant. MOB reported the dreams eventually went away and the symptoms slowly subsided. CSW assessed MOB for safety. MOB denied thoughts of harm to self and others. MOB identified her spouse, parents, brother, and sister-in-law as supports. ? ?CSW provided education regarding the baby blues period vs. perinatal mood disorders, discussed treatment and gave resources for mental health follow up if concerns arise.  CSW recommended MOB compete a self-evaluation during the postpartum time period using the New Mom  Checklist from Postpartum Progress and encouraged MOB to contact a medical professional if symptoms are noted at any time. MOB reported she feels comfortable reaching out to her provider if she has concerns.  ? ?CSW provided review of Sudden Infant Death Syndrome (SIDS) precautions. MOB reported she essential items for the infant including a bassinet where the infant will sleep. MOB has chosen [facility] for the infant's follow up care. MOB reported she receives WIC/FS and will call to update both agencies about the birth. CSW assessed MOB for additional need. MOB reported no further need.  ? ?CSW identifies no further need for intervention and no barriers to discharge at this time.  ? ?Tanzie Rothschild, MSW, LCSW ?[facility]  ?Clinical Social Worker  ?[phone] ?09/14/2021  12:58 PM  ? ? ?

## 2021-09-14 NOTE — Lactation Note (Signed)
This note was copied from a baby's chart. ?Lactation Consultation Note ? ?Patient Name: Alexis Hunt ?Today's Date: 09/14/2021 ?Reason for consult: Follow-up assessment;Mother's request;Term;Infant weight loss;Breastfeeding assistance ?Age:27 hours ? ?Mom states feeding going well, no complaints of nipple pain.  ?Mom in midst of a feeding on arrival.  ?Infant 6 %weight loss with adequate urine and stool output to account for it.  ? ?Plan 1. To feed based on cues 8-12x 24hr period.  ?2. Mom to offer breasts and look for signs of milk transfer.  ?All questions answered at the end of the visit.  ? ?Maternal Data ?Has patient been taught Hand Expression?: Yes ? ?Feeding ?  ? ?LATCH Score ?Latch: Grasps breast easily, tongue down, lips flanged, rhythmical sucking. ? ?Audible Swallowing: A few with stimulation ? ?Type of Nipple: Everted at rest and after stimulation ? ?Comfort (Breast/Nipple): Soft / non-tender ? ?Hold (Positioning): No assistance needed to correctly position infant at breast. ? ?LATCH Score: 9 ? ? ?Lactation Tools Discussed/Used ?  ? ?Interventions ?Interventions: Breast feeding basics reviewed;Breast compression;Position options;Expressed milk;Education;Infant Driven Feeding Algorithm education ? ?Discharge ?  ? ?Consult Status ?Consult Status: Follow-up ?Date: 09/15/21 ?Follow-up type: In-patient ? ? ? ?Riese Hellard  Nicholson-Springer ?09/14/2021, 1:23 PM ? ? ? ?

## 2021-09-15 MED ORDER — ACETAMINOPHEN 500 MG PO TABS: 1000.0000 mg | ORAL_TABLET | Freq: Four times a day (QID) | ORAL | 0 refills | Status: DC

## 2021-09-15 MED ORDER — IBUPROFEN 600 MG PO TABS: 600.0000 mg | ORAL_TABLET | Freq: Four times a day (QID) | ORAL | 0 refills | Status: DC

## 2021-09-15 MED ORDER — OXYCODONE HCL 5 MG PO TABS: 5.0000 mg | ORAL_TABLET | ORAL | 0 refills | Status: DC | PRN

## 2021-09-15 NOTE — Progress Notes (Signed)
Subjective: ?Postpartum Day 2: Cesarean Delivery ?Patient reports tolerating PO, + flatus, and no problems voiding.   ? ?Objective: ?Vital signs in last 24 hours: ?Temp:  [97.8 ?F (36.6 ?C)-98.3 ?F (36.8 ?C)] 98.3 ?F (36.8 ?C) (04/11 2120) ?Pulse Rate:  [92] 92 (04/11 2120) ?Resp:  [16-17] 17 (04/11 2120) ?BP: (125-131)/(83-95) 131/95 (04/11 2120) ?SpO2:  [98 %-99 %] 98 % (04/11 2120) ? ?Physical Exam:  ?General: alert and cooperative ?Lochia: appropriate ?Uterine Fundus: firm ?Incision: C/D/I ? ? ?Recent Labs  ?  09/14/21 ?0450  ?HGB 9.0*  ?HCT 26.4*  ? ? ?Assessment/Plan: ?Status post Cesarean section. Doing well postoperatively.  ?Discharge home with standard precautions and return to clinic in 2 weeks for incision check ?Alexis Hunt ?09/15/2021, 10:42 AM ? ? ?

## 2021-09-15 NOTE — Lactation Note (Signed)
This note was copied from a baby's chart. ?Lactation Consultation Note ? ?Patient Name: Alexis Hunt ?Today's Date: 09/15/2021 ?Reason for consult: Follow-up assessment;Term;Other (Comment);Nipple pain/trauma (+ weightg gain since yesterday and per mom milk is in both breast. mom showed LC Lt. sore nipple, intact positional strip. LC showed mom reverse pressure,provided shells with instructions. LC reviewed BF D/C teaching, mom aware of LC resources after D/C.) ?Age:72 hours ?Per mom breast feeding is going well , milk is in.  ? ?Maternal Data ?  ? ?Feeding ?Mother's Current Feeding Choice: Breast Milk ? ?LATCH Score ?  ? ?  ? ?  ? ?  ? ?  ? ?  ? ? ?Lactation Tools Discussed/Used ?Tools: Shells ?Flange Size: 24 ?Breast pump type: Manual ?Pump Education: Milk Storage ? ?Interventions ?Interventions: Breast feeding basics reviewed;Shells;Hand pump;Education;LC Services brochure ? ?Discharge ?Discharge Education: Engorgement and breast care;Warning signs for feeding baby ?Pump: Manual;Personal ? ?Consult Status ?Consult Status: Complete ?Date: 09/15/21 ? ? ? ?Matilde Sprang Averly Ericson ?09/15/2021, 10:41 AM ? ? ? ?

## 2021-09-15 NOTE — Discharge Summary (Signed)
? ?  Postpartum Discharge Summary ? ? ? ?   ?Patient Name: Alexis Hunt ?DOB: [birth year] ?MRN: [id number] ? ?Date of admission: 09/13/2021 ?Delivery date:09/13/2021  ?Delivering provider: Jackelyn Knife, TODD  ?Date of discharge: 09/15/2021 ? ?Admitting diagnosis: S/P cesarean section [Z98.891] ?Maternal care due to low transverse uterine scar from previous cesarean delivery [O34.211] ?Cesarean delivery delivered [O82] ?Intrauterine pregnancy: [redacted]w[redacted]d     ?Secondary diagnosis:  Principal Problem: ?  S/P cesarean section ?Active Problems: ?  Cesarean delivery delivered ?  Maternal care due to low transverse uterine scar from previous cesarean delivery ? ?Additional problems: mild acute blood loss anemia    ?Discharge diagnosis: Term Pregnancy Delivered                                              ?Post partum procedures: none ?Augmentation: N/A ?Complications: None ? ?Hospital course: Sceduled C/S   26 y.o. yo G2P2002 at [redacted]w[redacted]d was admitted to the hospital 09/13/2021 for scheduled cesarean section with the following indication:Elective Repeat.Delivery details are as follows:  ?Membrane Rupture Time/Date: 7:59 AM ,09/13/2021   ?Delivery Method:C-Section, Low Transverse  ?Details of operation can be found in separate operative note.  Patient had an uncomplicated postpartum course.  She is ambulating, tolerating a regular diet, passing flatus, and urinating well. Patient is discharged home in stable condition on  09/15/21 ?       ?Newborn Data: ?Birth date:09/[birth year]/2021  ?Birth time:7:59 AM  ?Gender:Female  ?Living status:Living  ?Apgars:8 ,9  ?Weight:3480 g    ? ? ? ?Physical exam  ?Vitals:  ? 09/14/21 0123 09/14/21 0542 09/14/21 1246 09/14/21 2120  ?BP: 126/77 119/79 125/83 (!) 131/95  ?Pulse: 60 66  92  ?Resp: 18 18 16 17   ?Temp: 97.8 ?F (36.6 ?C) 98.1 ?F (36.7 ?C) 97.8 ?F (36.6 ?C) 98.3 ?F (36.8 ?C)  ?TempSrc: Oral  Axillary Oral  ?SpO2: 97% 98% 99% 98%  ?Weight:      ?Height:      ? ?General: alert and cooperative ?Lochia:  appropriate ?Uterine Fundus: firm ?Incision: Dressing is clean, dry, and intact ? ?Labs: ?Lab Results  ?Component Value Date  ? WBC 14.4 (H) 09/14/2021  ? HGB 9.0 (L) 09/14/2021  ? HCT 26.4 (L) 09/14/2021  ? MCV 89.8 09/14/2021  ? PLT 178 09/14/2021  ? ? ?  Latest Ref Rng & Units 12/24/2020  ?  6:40 PM  ?CMP  ?Glucose 70 - 99 mg/dL 96    ?BUN 6 - 20 mg/dL 7    ?Creatinine 0.44 - 1.00 mg/dL 12/26/2020    ?Sodium 135 - 145 mmol/L 139    ?Potassium 3.5 - 5.1 mmol/L 3.7    ?Chloride 98 - 111 mmol/L 102    ?CO2 22 - 32 mmol/L 26    ?Calcium 8.9 - 10.3 mg/dL 8.88    ? ?Edinburgh Score: ? ?  09/13/2021  ?  3:08 PM  ?Edinburgh Postnatal Depression Scale Screening Tool  ?I have been able to laugh and see the funny side of things. 0  ?I have looked forward with enjoyment to things. 0  ?I have blamed myself unnecessarily when things went wrong. 0  ?I have been anxious or worried for no good reason. 2  ?I have felt scared or panicky for no good reason. 2  ?Things have been getting on top of me. 1  ?I have  been so unhappy that I have had difficulty sleeping. 0  ?I have felt sad or miserable. 0  ?I have been so unhappy that I have been crying. 0  ?The thought of harming myself has occurred to me. 0  ?Edinburgh Postnatal Depression Scale Total 5  ? ? ? ?After visit meds:  ?Allergies as of 09/15/2021   ?No Known Allergies ?  ? ?  ?Medication List  ?  ? ?STOP taking these medications   ? ?aspirin EC 81 MG tablet ?  ?clotrimazole-betamethasone cream ?Commonly known as: LOTRISONE ?  ? ?  ? ?TAKE these medications   ? ?acetaminophen 500 MG tablet ?Commonly known as: TYLENOL ?Take 2 tablets (1,000 mg total) by mouth every 6 (six) hours. ?  ?ibuprofen 600 MG tablet ?Commonly known as: ADVIL ?Take 1 tablet (600 mg total) by mouth every 6 (six) hours. ?  ?oxyCODONE 5 MG immediate release tablet ?Commonly known as: Oxy IR/ROXICODONE ?Take 1-2 tablets (5-10 mg total) by mouth every 4 (four) hours as needed for moderate pain. ?  ?prenatal multivitamin  Tabs tablet ?Take 1 tablet by mouth daily. ?  ? ?  ? ? ? ?Discharge home in stable condition ?Infant Feeding: Breast ?Infant Disposition:home with mother ?Discharge instruction: per After Visit Summary and Postpartum booklet. ?Activity: Advance as tolerated. Pelvic rest for 6 weeks.  ?Diet: routine diet ?Future Appointments:No future appointments. ?Follow up Visit: ? Follow-up Information   ? ? Meisinger, Todd, MD. Schedule an appointment as soon as possible for a visit in 2 week(s).   ?Specialty: Obstetrics and Gynecology ?Why: Incision check ?Contact information: ?[address], [address] ?[place] [address] ?[phone] ? ? ?  ?  ? ?  ?  ? ?  ? ? ? ?Please schedule this patient for a In person postpartum visit in  2 weeks  with the following provider: MD. ? ?Delivery mode:  C-Section, Low Transverse  ?Anticipated Birth Control:  Unsure ? ? ?09/15/2021 ?Oliver Pila, MD ? ? ? ?

## 2021-09-24 ENCOUNTER — Telehealth (HOSPITAL_COMMUNITY): Payer: Self-pay | Admitting: *Deleted

## 2021-09-24 NOTE — Telephone Encounter (Signed)
Left phone voicemail message. ? ?Duffy Rhody, RN 09-24-2021 at 1:37pm ?

## 2022-06-06 HISTORY — PX: COLPOSCOPY: SHX161

## 2022-08-11 DIAGNOSIS — R200 Anesthesia of skin: Secondary | ICD-10-CM | POA: Insufficient documentation

## 2022-08-12 DIAGNOSIS — F430 Acute stress reaction: Secondary | ICD-10-CM | POA: Insufficient documentation

## 2022-08-13 ENCOUNTER — Ambulatory Visit
Admission: EM | Admit: 2022-08-13 | Discharge: 2022-08-13 | Disposition: A | Discharge status: Home / Self Care | Payer: Medicaid Other | Attending: Internal Medicine | Admitting: Internal Medicine

## 2022-08-13 DIAGNOSIS — J029 Acute pharyngitis, unspecified: Secondary | ICD-10-CM | POA: Diagnosis present

## 2022-08-13 DIAGNOSIS — J069 Acute upper respiratory infection, unspecified: Secondary | ICD-10-CM | POA: Diagnosis present

## 2022-08-13 LAB — POCT INFLUENZA A/B
Influenza A, POC: NEGATIVE
Influenza B, POC: NEGATIVE

## 2022-08-13 LAB — POCT RAPID STREP A (OFFICE): Rapid Strep A Screen: NEGATIVE

## 2022-08-13 MED ORDER — BENZONATATE 100 MG PO CAPS: 100.0000 mg | ORAL_CAPSULE | Freq: Three times a day (TID) | ORAL | 0 refills | Status: DC | PRN

## 2022-08-13 MED ORDER — FLUTICASONE PROPIONATE 50 MCG/ACT NA SUSP: 1.0000 | Freq: Every day | NASAL | 0 refills | Status: DC

## 2022-08-13 MED ORDER — CETIRIZINE HCL 10 MG PO TABS: 10.0000 mg | ORAL_TABLET | Freq: Every day | ORAL | 0 refills | Status: DC

## 2022-08-13 NOTE — ED Triage Notes (Signed)
Patient presents to UC for fever, chills, sore throat, and bilateral ear fullness x yesterday. Not taking any medications.

## 2022-08-13 NOTE — ED Provider Notes (Addendum)
[facility]    CSN: [id number] Arrival date & time: 08/13/22  1048      History   Chief Complaint Chief Complaint  Patient presents with   Fever   Ear Fullness    HPI THERESITA KOPMAN is a 27 y.o. female.   Patient presents with fever, chills, sore throat, nasal congestion, bilateral ear fullness that started yesterday.  Patient reports several known sick contacts.  Temp max at home was 100.3.  Patient is not taking any medications to help alleviate symptoms.  Denies chest pain, shortness of breath, gastrointestinal symptoms.   Fever Ear Fullness    Past Medical History:  Diagnosis Date   Anxiety    Cesarean delivery delivered 12/10/2019   Concussion    Family history of adverse reaction to anesthesia    dad possible seizures   History of gestational hypertension    Pregnancy induced hypertension     Patient Active Problem List   Diagnosis Date Noted   Acute stress disorder 08/12/2022   Numbness of finger 08/11/2022   S/P cesarean section 09/13/2021   Maternal care due to low transverse uterine scar from previous cesarean delivery 09/13/2021   Cesarean delivery delivered 12/10/2019   Arrest of dilation, delivered, current hospitalization 12/08/2019   PIH (pregnancy induced hypertension), third trimester 12/02/2019   Gestational hypertension 11/25/2019   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   COVID-19 07/19/2019   Generalized anxiety disorder 05/13/2019   Pregnancy 05/13/2019    Past Surgical History:  Procedure Laterality Date   CESAREAN SECTION N/A 12/08/2019   Procedure: CESAREAN SECTION;  Surgeon: Cheri Sharmain Lastra, MD;  Location: [facility] LD ORS;  Service: Obstetrics;  Laterality: N/A;   CESAREAN SECTION N/A 09/13/2021   Procedure: CESAREAN SECTION;  Surgeon: Cheri Aliceson Dolbow, MD;  Location: [facility];  Service: Obstetrics;  Laterality: N/A;   NO PAST SURGERIES      OB History     Gravida  2   Para  2   Term  2   Preterm  0   AB  0   Living  2       SAB  0   IAB  0   Ectopic  0   Multiple  0   Live Births  2            Home Medications    Prior to Admission medications   Medication Sig Start Date End Date Taking? Authorizing Provider  benzonatate (TESSALON) 100 MG capsule Take 1 capsule (100 mg total) by mouth every 8 (eight) hours as needed for cough. 08/13/22  Yes Leor Whyte, Michele Rockers, FNP  cetirizine (ZYRTEC) 10 MG tablet Take 1 tablet (10 mg total) by mouth daily. 08/13/22  Yes Vannie Hochstetler, Hildred Alamin E, FNP  fluticasone (FLONASE) 50 MCG/ACT nasal spray Place 1 spray into both nostrils daily. 08/13/22  Yes Kaile Bixler, Hildred Alamin E, FNP  acetaminophen (TYLENOL) 500 MG tablet Take 2 tablets (1,000 mg total) by mouth every 6 (six) hours. 09/15/21   Paula Compton, MD  ibuprofen (ADVIL) 600 MG tablet Take 1 tablet (600 mg total) by mouth every 6 (six) hours. 09/15/21   Paula Compton, MD  oxyCODONE (OXY IR/ROXICODONE) 5 MG immediate release tablet Take 1-2 tablets (5-10 mg total) by mouth every 4 (four) hours as needed for moderate pain. 09/15/21   Paula Compton, MD  Prenatal Vit-Fe Fumarate-FA (PRENATAL MULTIVITAMIN) TABS tablet Take 1 tablet by mouth daily.    [provider]  pantoprazole (PROTONIX) 20 MG tablet Take 1 tablet (  20 mg total) by mouth daily. 12/07/18 04/09/19  Milton Ferguson, MD    Family History Family History  Problem Relation Age of Onset   Healthy Mother    Hypertension Mother    Healthy Father    Heart disease Maternal Grandfather    Hyperlipidemia Maternal Grandfather    Stroke Maternal Grandfather    Hyperlipidemia Paternal Grandmother    Hypertension Paternal Grandmother    Hyperlipidemia Paternal Grandfather     Social History Social History   Tobacco Use   Smoking status: Former    Packs/day: 0.25    Types: Cigarettes    Quit date: 09/09/2021    Years since quitting: 0.9   Smokeless tobacco: Never  Vaping Use   Vaping Use: Never used  Substance Use Topics   Alcohol use: No    Alcohol/week:  0.0 standard drinks of alcohol   Drug use: No     Allergies   Patient has no known allergies.   Review of Systems Review of Systems Per HPI  Physical Exam Triage Vital Signs ED Triage Vitals  Enc Vitals Group     BP 08/13/22 1101 123/83     Pulse Rate 08/13/22 1101 (!) 102     Resp 08/13/22 1101 16     Temp 08/13/22 1101 97.8 F (36.6 C)     Temp Source 08/13/22 1101 Oral     SpO2 08/13/22 1101 98 %     Weight --      Height --      Head Circumference --      Peak Flow --      Pain Score 08/13/22 1102 0     Pain Loc --      Pain Edu? --      Excl. in [place]? --    No data found.  Updated Vital Signs BP 123/83 (BP Location: Left Arm)   Pulse (!) 102   Temp 97.8 F (36.6 C) (Oral)   Resp 16   LMP 08/13/2022   SpO2 98%   Breastfeeding No   Visual Acuity Right Eye Distance:   Left Eye Distance:   Bilateral Distance:    Right Eye Near:   Left Eye Near:    Bilateral Near:     Physical Exam Constitutional:      General: She is not in acute distress.    Appearance: Normal appearance. She is not toxic-appearing or diaphoretic.  HENT:     Head: Normocephalic and atraumatic.     Right Ear: Tympanic membrane and ear canal normal.     Left Ear: Tympanic membrane and ear canal normal.     Nose: Congestion present.     Mouth/Throat:     Mouth: Mucous membranes are moist.     Pharynx: Posterior oropharyngeal erythema present.  Eyes:     Extraocular Movements: Extraocular movements intact.     Conjunctiva/sclera: Conjunctivae normal.     Pupils: Pupils are equal, round, and reactive to light.  Cardiovascular:     Rate and Rhythm: Normal rate and regular rhythm.     Pulses: Normal pulses.     Heart sounds: Normal heart sounds.  Pulmonary:     Effort: Pulmonary effort is normal. No respiratory distress.     Breath sounds: Normal breath sounds. No stridor. No wheezing, rhonchi or rales.  Abdominal:     General: Abdomen is flat. Bowel sounds are normal.      Palpations: Abdomen is soft.  Musculoskeletal:  General: Normal range of motion.     Cervical back: Normal range of motion.  Skin:    General: Skin is warm.  Neurological:     General: No focal deficit present.     Mental Status: She is alert and oriented to person, place, and time. Mental status is at baseline.  Psychiatric:        Mood and Affect: Mood normal.        Behavior: Behavior normal.      UC Treatments / Results  Labs (all labs ordered are listed, but only abnormal results are displayed) Labs Reviewed  CULTURE, GROUP A STREP [facility])  POCT RAPID STREP A (OFFICE)  POCT INFLUENZA A/B    EKG   Radiology No results found.  Procedures Procedures (including critical care time)  Medications Ordered in UC Medications - No data to display  Initial Impression / Assessment and Plan / UC Course  I have reviewed the triage vital signs and the nursing notes.  Pertinent labs & imaging results that were available during my care of the patient were reviewed by me and considered in my medical decision making (see chart for details).     Patient presents with symptoms likely from a viral upper respiratory infection.Do not suspect underlying cardiopulmonary process. Symptoms seem unlikely related to ACS, CHF or COPD exacerbations, pneumonia, pneumothorax. Patient is nontoxic appearing and not in need of emergent medical intervention.  Rapid flu and rapid strep negative.  Throat culture pending.  Do not have flu PCR capabilities here in urgent care.  Recommended symptom control with medications and supportive care.  Patient was sent prescriptions, and she denies that she takes any daily medications so this should be safe.   Return if symptoms fail to improve in 1-2 weeks or you develop shortness of breath, chest pain, severe headache. Patient states understanding and is agreeable.  Discharged with PCP followup.  Final Clinical Impressions(s) / UC Diagnoses   Final  diagnoses:  Viral upper respiratory infection  Sore throat     Discharge Instructions      Rapid flu and rapid strep are negative.  Throat culture pending.  Will call if it is abnormal.  It appears that you have a viral illness that should run its course and self resolve with the help of symptomatic treatment as we discussed.  Please follow-up if any symptoms persist or worsen.     ED Prescriptions     Medication Sig Dispense Auth. Provider   cetirizine (ZYRTEC) 10 MG tablet Take 1 tablet (10 mg total) by mouth daily. 30 tablet [place], [place] E, [place]   fluticasone [facility]) 50 MCG/ACT nasal spray Place 1 spray into both nostrils daily. 16 g Afreen Siebels, Hildred Alamin E, [place]   benzonatate (TESSALON) 100 MG capsule Take 1 capsule (100 mg total) by mouth every 8 (eight) hours as needed for cough. 21 capsule [place], Michele Rockers, [place]      PDMP not reviewed this encounter.   Teodora Medici, [place] 08/13/22 [place], [place], [place] 08/13/22 1150

## 2022-08-13 NOTE — Discharge Instructions (Signed)
Rapid flu and rapid strep are negative.  Throat culture pending.  Will call if it is abnormal.  It appears that you have a viral illness that should run its course and self resolve with the help of symptomatic treatment as we discussed.  Please follow-up if any symptoms persist or worsen.

## 2022-08-16 LAB — CULTURE, GROUP A STREP (THRC)

## 2022-10-03 ENCOUNTER — Encounter (HOSPITAL_COMMUNITY): Payer: Self-pay

## 2022-10-03 ENCOUNTER — Ambulatory Visit (INDEPENDENT_AMBULATORY_CARE_PROVIDER_SITE_OTHER): Payer: Medicaid Other

## 2022-10-03 ENCOUNTER — Ambulatory Visit (HOSPITAL_COMMUNITY)
Admission: EM | Admit: 2022-10-03 | Discharge: 2022-10-03 | Disposition: A | Discharge status: Home / Self Care | Payer: Medicaid Other | Attending: Physician Assistant | Admitting: Physician Assistant

## 2022-10-03 DIAGNOSIS — S161XXA Strain of muscle, fascia and tendon at neck level, initial encounter: Secondary | ICD-10-CM

## 2022-10-03 DIAGNOSIS — M5412 Radiculopathy, cervical region: Secondary | ICD-10-CM

## 2022-10-03 DIAGNOSIS — M545 Low back pain, unspecified: Secondary | ICD-10-CM

## 2022-10-03 MED ORDER — IBUPROFEN 800 MG PO TABS: 800.0000 mg | ORAL_TABLET | Freq: Three times a day (TID) | ORAL | 0 refills | Status: DC

## 2022-10-03 MED ORDER — METHOCARBAMOL 500 MG PO TABS: 500.0000 mg | ORAL_TABLET | Freq: Two times a day (BID) | ORAL | 0 refills | Status: DC
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
## 2022-10-03 NOTE — Discharge Instructions (Signed)
Your x-rays did not show any abnormalities with the bones.  It does show that you have some straightening of your neck which is associated with muscle spasms.  Please take ibuprofen 800 mg for pain relief.  Do not take NSAIDs with this medication including aspirin, ibuprofen/Advil, naproxen/Aleve.  Take Robaxin up to twice a day.  This make you sleepy so do not drive or drink alcohol while taking it.  Use heat and gentle stretch for symptom relief.  Follow-up with sports medicine as they can help establish you with physical therapy or do other interventions.  Call them to schedule an appointment.  If you have any worsening symptoms including weakness, numbness, increasing pain, fever, nausea, vomiting you should be seen immediately.
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
## 2022-10-05 ENCOUNTER — Ambulatory Visit (INDEPENDENT_AMBULATORY_CARE_PROVIDER_SITE_OTHER): Payer: Self-pay | Admitting: Sports Medicine

## 2022-10-05 VITALS — BP 120/82 | Ht 61.0 in | Wt 150.0 lb

## 2022-10-05 DIAGNOSIS — S161XXA Strain of muscle, fascia and tendon at neck level, initial encounter: Secondary | ICD-10-CM | POA: Insufficient documentation

## 2022-10-05 DIAGNOSIS — S39012A Strain of muscle, fascia and tendon of lower back, initial encounter: Secondary | ICD-10-CM

## 2022-10-05 NOTE — Assessment & Plan Note (Signed)
Recommend mentation to continue with ibuprofen and muscle relaxer as tolerated and heat to the area.  I have also ordered formal physical therapy for her.  This should continue to improve over the next couple weeks.  We will follow-up with her in 4 to 6 weeks after she is begun physical therapy

## 2022-10-05 NOTE — Assessment & Plan Note (Signed)
Patient with low back pain consistent with lumbar strain.  Recommend she continue with heat to the area, ibuprofen 800 mg twice daily with meals and muscle relaxer as previously prescribed if she is getting some relief with this medicine.  I have sent an order for formal physical therapy to help with this pain and work on her range of motion and strength.  Will follow-up with her in 4 to 6 weeks after she is gone physical therapy.
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
## 2023-06-03 ENCOUNTER — Ambulatory Visit (HOSPITAL_BASED_OUTPATIENT_CLINIC_OR_DEPARTMENT_OTHER)
Admission: RE | Admit: 2023-06-03 | Discharge: 2023-06-03 | Disposition: A | Discharge status: Home / Self Care | Payer: 59 | Source: Ambulatory Visit | Attending: Internal Medicine

## 2023-06-03 ENCOUNTER — Encounter (HOSPITAL_BASED_OUTPATIENT_CLINIC_OR_DEPARTMENT_OTHER): Payer: Self-pay

## 2023-06-03 VITALS — BP 156/88 | HR 99 | Temp 98.7°F | Resp 18

## 2023-06-03 DIAGNOSIS — B001 Herpesviral vesicular dermatitis: Secondary | ICD-10-CM | POA: Diagnosis not present

## 2023-06-03 DIAGNOSIS — F419 Anxiety disorder, unspecified: Secondary | ICD-10-CM

## 2023-06-03 MED ORDER — HYDROXYZINE HCL 25 MG PO TABS: 25.0000 mg | ORAL_TABLET | Freq: Three times a day (TID) | ORAL | 0 refills | Status: DC | PRN

## 2023-06-03 MED ORDER — VALACYCLOVIR HCL 1 G PO TABS: 2000.0000 mg | ORAL_TABLET | Freq: Two times a day (BID) | ORAL | 0 refills | Status: AC

## 2023-06-03 NOTE — Discharge Instructions (Addendum)
Follow-up with your primary care doctor regarding your anxiety

## 2023-06-03 NOTE — ED Provider Notes (Signed)
[facility]    CSN: [id number] Arrival date & time: 06/03/23  1356      History   Chief Complaint Chief Complaint  Patient presents with   Mouth Lesions    Cold sore - Entered by patient    HPI Alexis Hunt is a 27 y.o. female.    Mouth Lesions Associated symptoms: rash   Associated symptoms: no fever   Fever blister on her lower lip onset yesterday now with local swelling, has had similar symptoms in the past but not for 10 years.  Also having enlarged tender lymph nodes on her neck admits having a lot of anxiety over the last few days not currently taking anything for anxiety, not have a primary care doctor.  Denies rhinorrhea, nasal congestion, sore throat, fever, chills, sweats  Past Medical History:  Diagnosis Date   Anxiety    Cesarean delivery delivered 12/10/2019   Concussion    Family history of adverse reaction to anesthesia    dad possible seizures   History of gestational hypertension    Pregnancy induced hypertension     Patient Active Problem List   Diagnosis Date Noted   Cervical strain 10/05/2022   Strain of lumbar region 10/05/2022   Acute stress disorder 08/12/2022   Numbness of finger 08/11/2022   S/P cesarean section 09/13/2021   Maternal care due to low transverse uterine scar from previous cesarean delivery 09/13/2021   Cesarean delivery delivered 12/10/2019   Arrest of dilation, delivered, current hospitalization 12/08/2019   PIH (pregnancy induced hypertension), third trimester 12/02/2019   Gestational hypertension 11/25/2019   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   COVID-19 07/19/2019   Generalized anxiety disorder 05/13/2019   Pregnancy 05/13/2019    Past Surgical History:  Procedure Laterality Date   CESAREAN SECTION N/A 12/08/2019   Procedure: CESAREAN SECTION;  Surgeon: Lavina Hamman, MD;  Location: [facility];  Service: Obstetrics;  Laterality: N/A;   CESAREAN SECTION N/A 09/13/2021   Procedure: CESAREAN SECTION;  Surgeon:  Lavina Hamman, MD;  Location: [facility];  Service: Obstetrics;  Laterality: N/A;   NO PAST SURGERIES      OB History     Gravida  2   Para  2   Term  2   Preterm  0   AB  0   Living  2      SAB  0   IAB  0   Ectopic  0   Multiple  0   Live Births  2            Home Medications    Prior to Admission medications   Medication Sig Start Date End Date Taking? Authorizing Provider  acetaminophen (TYLENOL) 500 MG tablet Take 2 tablets (1,000 mg total) by mouth every 6 (six) hours. 09/15/21   Huel Cote, MD  benzonatate (TESSALON) 100 MG capsule Take 1 capsule (100 mg total) by mouth every 8 (eight) hours as needed for cough. 08/13/22   Gustavus Bryant, FNP  cetirizine (ZYRTEC) 10 MG tablet Take 1 tablet (10 mg total) by mouth daily. 08/13/22   Gustavus Bryant, FNP  fluticasone (FLONASE) 50 MCG/ACT nasal spray Place 1 spray into both nostrils daily. 08/13/22   Gustavus Bryant, FNP  ibuprofen (ADVIL) 800 MG tablet Take 1 tablet (800 mg total) by mouth 3 (three) times daily. 10/03/22   Raspet, Noberto Retort, PA-C  methocarbamol (ROBAXIN) 500 MG tablet Take 1 tablet (500 mg total) by mouth 2 (two) times  daily. 10/03/22   Raspet, Noberto Retort, PA-C  Prenatal Vit-Fe Fumarate-FA (PRENATAL MULTIVITAMIN) TABS tablet Take 1 tablet by mouth daily.    [provider]  pantoprazole (PROTONIX) 20 MG tablet Take 1 tablet (20 mg total) by mouth daily. 12/07/18 04/09/19  Bethann Berkshire, MD    Family History Family History  Problem Relation Age of Onset   Healthy Mother    Hypertension Mother    Healthy Father    Heart disease Maternal Grandfather    Hyperlipidemia Maternal Grandfather    Stroke Maternal Grandfather    Hyperlipidemia Paternal Grandmother    Hypertension Paternal Grandmother    Hyperlipidemia Paternal Grandfather     Social History Social History   Tobacco Use   Smoking status: Former    Current packs/day: 0.00    Types: Cigarettes    Quit date: 09/09/2021    Years  since quitting: 1.7   Smokeless tobacco: Never  Vaping Use   Vaping status: Never Used  Substance Use Topics   Alcohol use: No    Alcohol/week: 0.0 standard drinks of alcohol   Drug use: No     Allergies   Patient has no known allergies.   Review of Systems Review of Systems  Constitutional:  Negative for diaphoresis, fatigue and fever.  HENT:  Positive for mouth sores.   Respiratory:  Negative for cough.   Skin:  Positive for rash.     Physical Exam Triage Vital Signs ED Triage Vitals  Encounter Vitals Group     BP 06/03/23 1422 (!) 156/88     Systolic BP Percentile --      Diastolic BP Percentile --      Pulse Rate 06/03/23 1422 99     Resp 06/03/23 1422 18     Temp 06/03/23 1422 98.7 F (37.1 C)     Temp Source 06/03/23 1422 Oral     SpO2 06/03/23 1422 97 %     Weight --      Height --      Head Circumference --      Peak Flow --      Pain Score 06/03/23 1421 0     Pain Loc --      Pain Education --      Exclude from Growth Chart --    No data found.  Updated Vital Signs BP (!) 156/88 (BP Location: Right Arm)   Pulse 99   Temp 98.7 F (37.1 C) (Oral)   Resp 18   LMP 05/17/2023 (Approximate)   SpO2 97%   Visual Acuity Right Eye Distance:   Left Eye Distance:   Bilateral Distance:    Right Eye Near:   Left Eye Near:    Bilateral Near:     Physical Exam Vitals and nursing note reviewed.  Constitutional:      Appearance: She is not ill-appearing.  HENT:     Head: Normocephalic.     Nose: No rhinorrhea.     Mouth/Throat:     Mouth: Mucous membranes are moist.     Pharynx: No oropharyngeal exudate or posterior oropharyngeal erythema.     Comments: Fever blister right lower lip with mild local swelling and crusting Eyes:     Conjunctiva/sclera: Conjunctivae normal.  Cardiovascular:     Rate and Rhythm: Normal rate.     Heart sounds: Normal heart sounds.  Pulmonary:     Effort: Pulmonary effort is normal.     Breath sounds: Normal breath  sounds.  Musculoskeletal:  Cervical back: Neck supple.  Lymphadenopathy:     Cervical: Cervical adenopathy (Right anterior cervical mobile mildly tender) present.  Neurological:     Mental Status: She is alert.  Psychiatric:        Mood and Affect: Mood normal.      UC Treatments / Results  Labs (all labs ordered are listed, but only abnormal results are displayed) Labs Reviewed - No data to display  EKG   Radiology No results found.  Procedures Procedures (including critical care time)  Medications Ordered in UC Medications - No data to display  Initial Impression / Assessment and Plan / UC Course  I have reviewed the triage vital signs and the nursing notes.  Pertinent labs & imaging results that were available during my care of the patient were reviewed by me and considered in my medical decision making (see chart for details).    27 year old with fever blister lower lip, will treat with Valtrex, also asking for something for anxiety, discussed with patient she will need to follow-up with a PCP management of anxiety will start on Atarax to help with symptoms until she is seen Final Clinical Impressions(s) / UC Diagnoses   Final diagnoses:  None   Discharge Instructions   None    ED Prescriptions   None    PDMP not reviewed this encounter.   Meliton Rattan, [place] 06/03/23 1432

## 2023-06-03 NOTE — ED Triage Notes (Signed)
Pt reports she has a cold sore to her right lower lip started yesterday, swollen lymph node to right side of neck.

## 2023-06-18 IMAGING — CT CT ANGIO CHEST
2 of 7 series · 17 of 46 positions shown · IV contrast (omnipaque)
Comparison: Chest radiographs dated 12/24/2020

CLINICAL DATA: Left chest pain, evaluate for PE

EXAM:
CT ANGIOGRAPHY CHEST WITH CONTRAST
TECHNIQUE: Multidetector CT imaging of the chest was performed using the
standard protocol during bolus administration of intravenous
contrast. Multiplanar CT image reconstructions and MIPs were
obtained to evaluate the vascular anatomy.
CONTRAST:  100mL OMNIPAQUE IOHEXOL 350 MG/ML SOLN

[Series 5: pe axial thins · axial · 0.66mm/px · z∈[+1113,+1342]mm · 14 of 265 slices shown]
[im 18/265  lung]
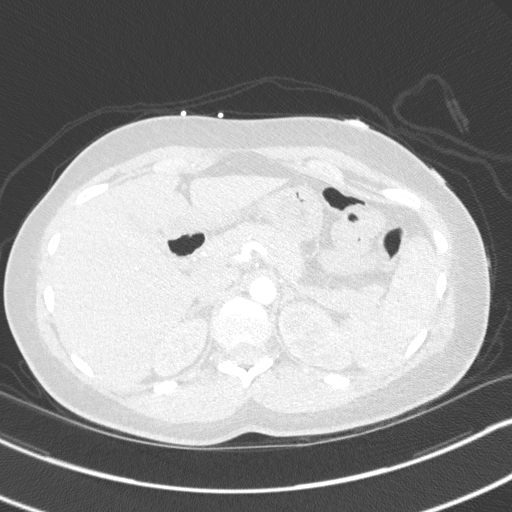
[im 36/265  soft-tissue]
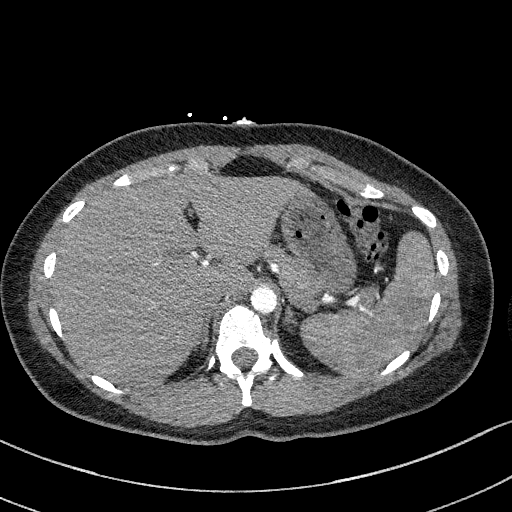
[im 53/265  lung]
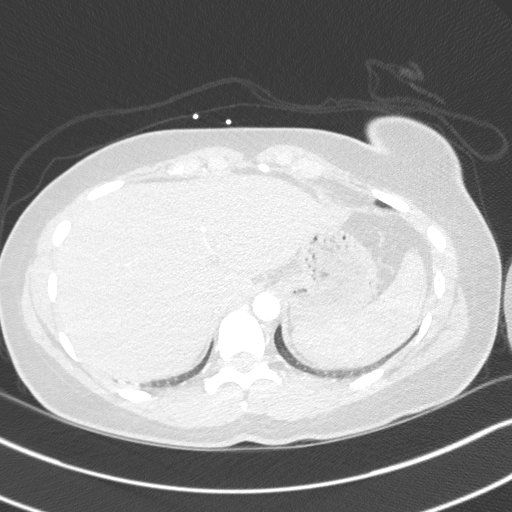
[im 71/265  soft-tissue]
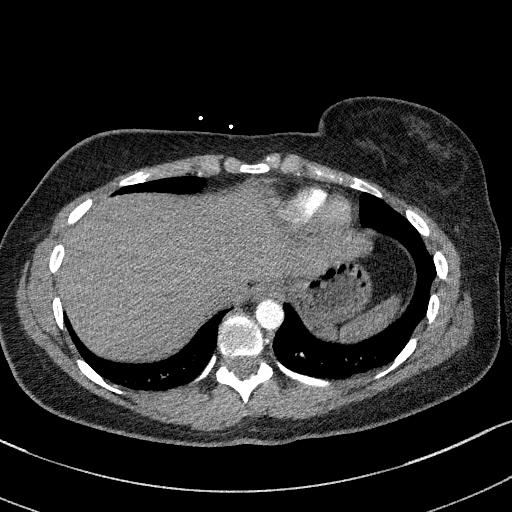
[im 89/265  lung]
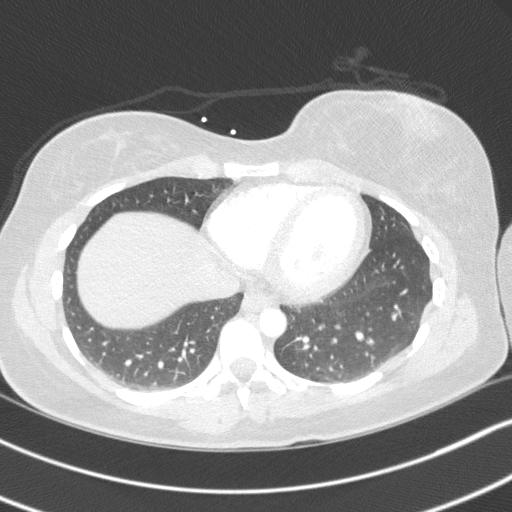
[im 106/265  soft-tissue]
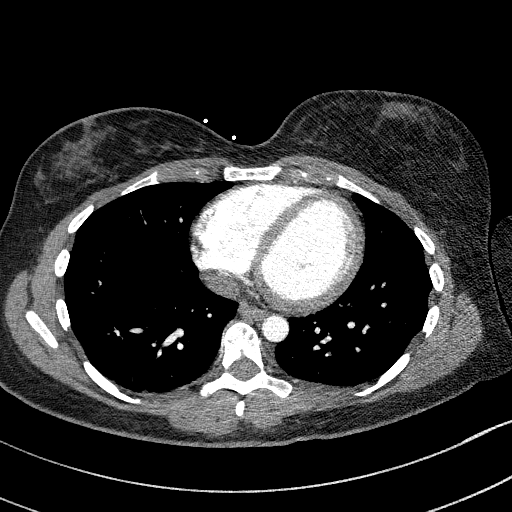
[im 124/265  lung]
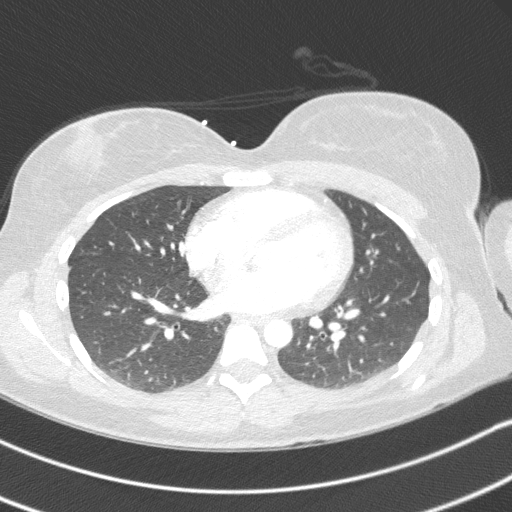
[im 141/265  soft-tissue]
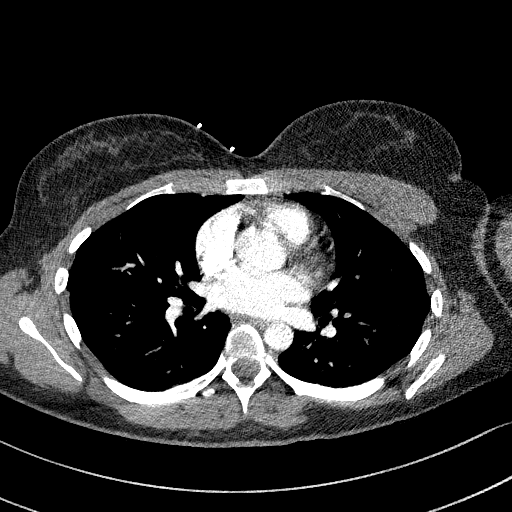
[im 159/265  lung]
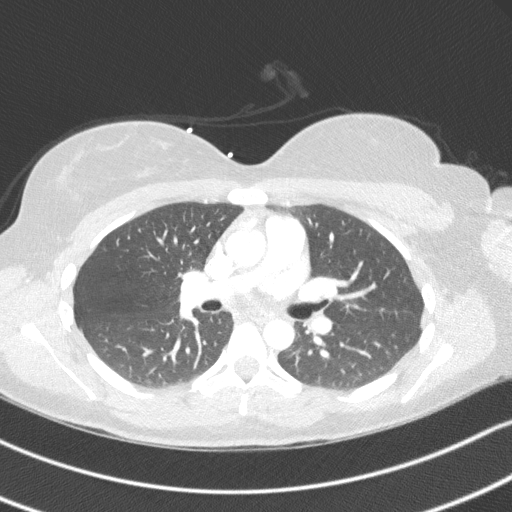
[im 177/265  soft-tissue]
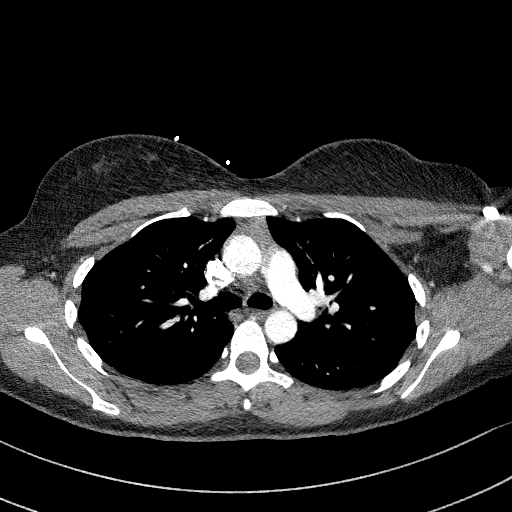
[im 194/265  lung]
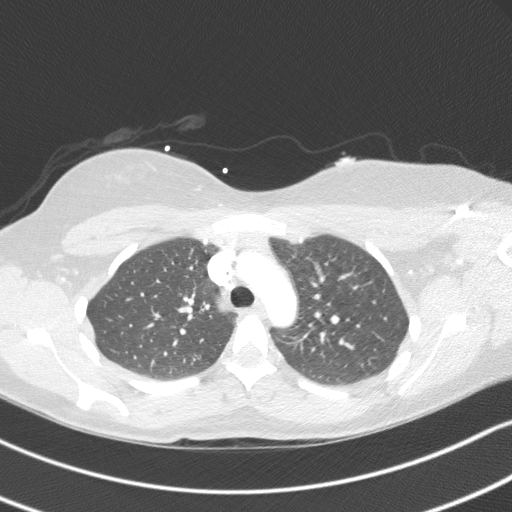
[im 212/265  soft-tissue]
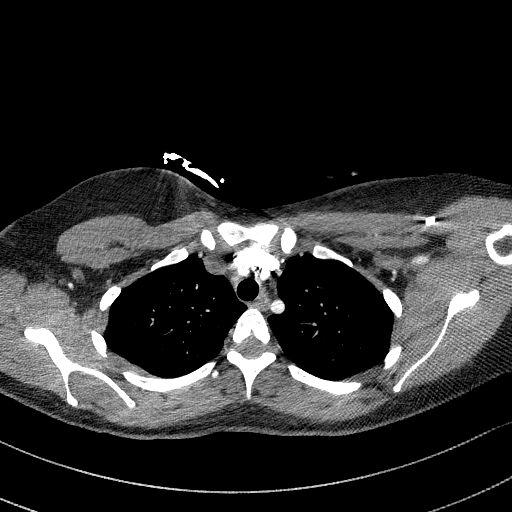
[im 229/265  lung]
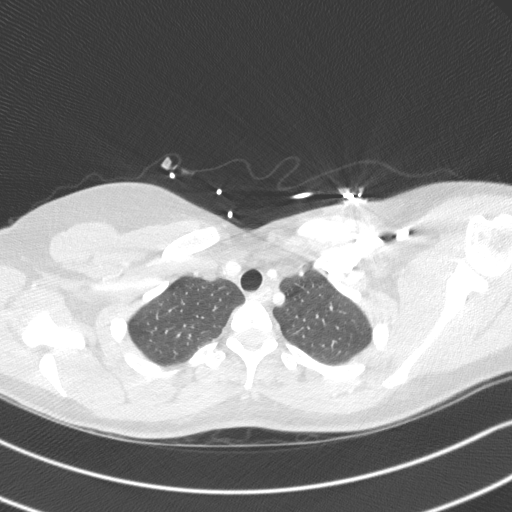
[im 247/265  soft-tissue]
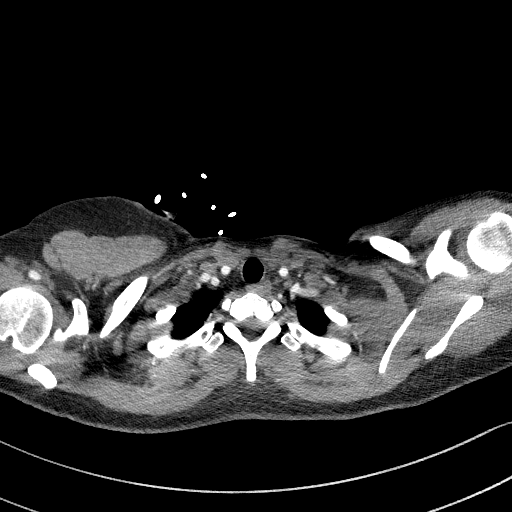

[Series 7: cor soft · coronal · 0.55mm/px · 3 of 103 slices shown]
[im 26/103  soft-tissue]
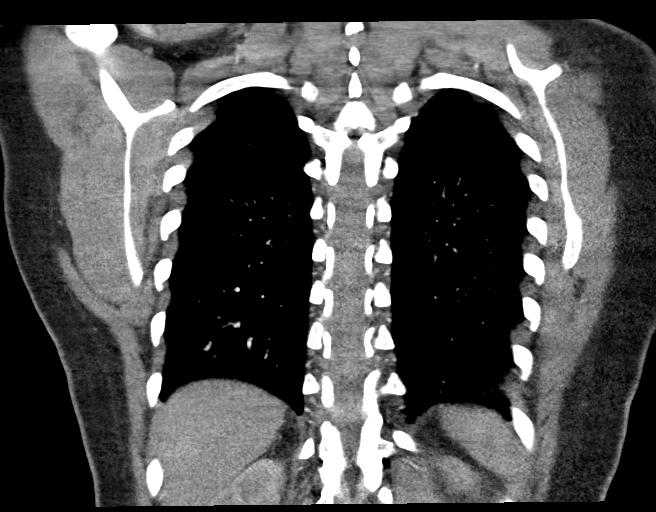
[im 52/103  soft-tissue]
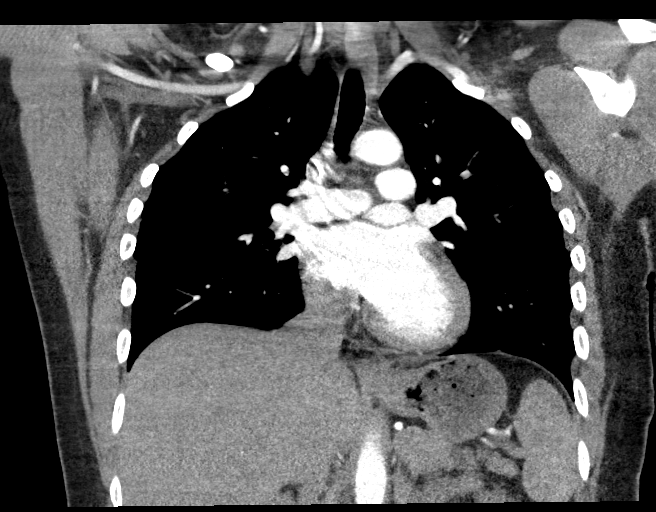
[im 77/103  soft-tissue]
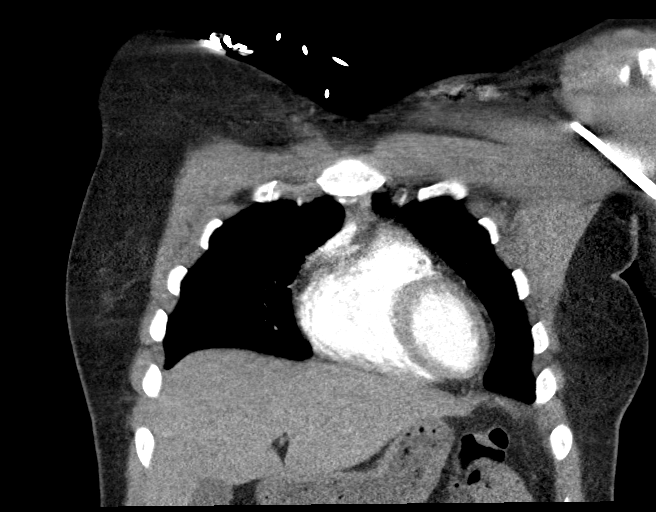

[17 of 46 positions shown; findings below may reference images not displayed]

FINDINGS: Cardiovascular: Satisfactory opacification of the bilateral
pulmonary arteries to the lobar level. No evidence of pulmonary
embolism.

Although not tailored for evaluation of the thoracic aorta, there is
no evidence of thoracic aortic aneurysm or dissection.

The heart is normal in size.  No pericardial effusion.

Mediastinum/Nodes: No suspicious mediastinal lymphadenopathy.

Visualized thyroid is unremarkable.

Lungs/Pleura: Lungs are clear.

No suspicious pulmonary nodules.

No focal consolidation.

No pleural effusion or pneumothorax.

Upper Abdomen: Visualized upper abdomen is grossly unremarkable.

Musculoskeletal: Visualized osseous structures are within normal
limits.

Review of the MIP images confirms the above findings.
IMPRESSION: No evidence of pulmonary embolism.

Negative CT chest.

## 2023-08-09 ENCOUNTER — Encounter (HOSPITAL_COMMUNITY): Payer: Self-pay

## 2023-08-09 ENCOUNTER — Ambulatory Visit (HOSPITAL_COMMUNITY)
Admission: EM | Admit: 2023-08-09 | Discharge: 2023-08-09 | Disposition: A | Discharge status: Home / Self Care | Attending: Emergency Medicine | Admitting: Emergency Medicine

## 2023-08-09 DIAGNOSIS — R42 Dizziness and giddiness: Secondary | ICD-10-CM | POA: Insufficient documentation

## 2023-08-09 DIAGNOSIS — F419 Anxiety disorder, unspecified: Secondary | ICD-10-CM | POA: Diagnosis not present

## 2023-08-09 DIAGNOSIS — R112 Nausea with vomiting, unspecified: Secondary | ICD-10-CM | POA: Diagnosis not present

## 2023-08-09 DIAGNOSIS — R079 Chest pain, unspecified: Secondary | ICD-10-CM | POA: Diagnosis not present

## 2023-08-09 LAB — CBC
HCT: 41.6 % (ref 36.0–46.0)
Hemoglobin: 13.8 g/dL (ref 12.0–15.0)
MCH: 29.5 pg (ref 26.0–34.0)
MCHC: 33.2 g/dL (ref 30.0–36.0)
MCV: 88.9 fL (ref 80.0–100.0)
Platelets: 344 10*3/uL (ref 150–400)
RBC: 4.68 MIL/uL (ref 3.87–5.11)
RDW: 12.1 % (ref 11.5–15.5)
WBC: 9.5 10*3/uL (ref 4.0–10.5)
nRBC: 0 % (ref 0.0–0.2)

## 2023-08-09 LAB — BASIC METABOLIC PANEL
Anion gap: 15 (ref 5–15)
BUN: 7 mg/dL (ref 6–20)
CO2: 22 mmol/L (ref 22–32)
Calcium: 9.6 mg/dL (ref 8.9–10.3)
Chloride: 104 mmol/L (ref 98–111)
Creatinine, Ser: 0.56 mg/dL (ref 0.44–1.00)
GFR, Estimated: >60 mL/min (ref >60)
Glucose, Bld: 60 mg/dL — ABNORMAL LOW (ref 70–99)
Potassium: 3.6 mmol/L (ref 3.5–5.1)
Sodium: 141 mmol/L (ref 135–145)

## 2023-08-09 LAB — POCT INFLUENZA A/B
Influenza A, POC: NEGATIVE
Influenza B, POC: NEGATIVE

## 2023-08-09 LAB — POCT FASTING CBG KUC MANUAL ENTRY: POCT Glucose (KUC): 96 mg/dL (ref 70–99)

## 2023-08-09 LAB — POCT URINE PREGNANCY: Preg Test, Ur: NEGATIVE

## 2023-08-09 MED ORDER — ONDANSETRON 4 MG PO TBDP: 4.0000 mg | ORAL_TABLET | Freq: Three times a day (TID) | ORAL | 0 refills | Status: DC | PRN

## 2023-08-09 MED ORDER — HYDROXYZINE HCL 25 MG PO TABS: 25.0000 mg | ORAL_TABLET | Freq: Three times a day (TID) | ORAL | 0 refills | Status: DC | PRN

## 2023-08-09 NOTE — ED Provider Notes (Signed)
 [facility]    CSN: [id number] Arrival date & time: 08/09/23  1048      History   Chief Complaint Chief Complaint  Patient presents with   Nausea    Nausea, vomiting, dizziness and chest pain x1 day    HPI Alexis Hunt is a 28 y.o. female.   Patient presents with intermittent nausea, vomiting, dizziness, and chest pain x 1 week.  Patient states that symptoms became worse yesterday and she had more dizzy spells than normal.  Denies syncope, fever, diarrhea, cough, and congestion.  Patient also endorses history of anxiety and states that she thinks her chest pain may be related to this.  Patient reports that the pain radiates down her left arm.  Patient denies take anything regularly for anxiety, but states that she has taken hydroxyzine in the past with relief.     Past Medical History:  Diagnosis Date   Anxiety    Cesarean delivery delivered 12/10/2019   Concussion    Family history of adverse reaction to anesthesia    dad possible seizures   History of gestational hypertension    Pregnancy induced hypertension     Patient Active Problem List   Diagnosis Date Noted   Cervical strain 10/05/2022   Strain of lumbar region 10/05/2022   Acute stress disorder 08/12/2022   Numbness of finger 08/11/2022   S/P cesarean section 09/13/2021   Maternal care due to low transverse uterine scar from previous cesarean delivery 09/13/2021   Cesarean delivery delivered 12/10/2019   Arrest of dilation, delivered, current hospitalization 12/08/2019   PIH (pregnancy induced hypertension), third trimester 12/02/2019   Gestational hypertension 11/25/2019   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   COVID-19 07/19/2019   Generalized anxiety disorder 05/13/2019   Pregnancy 05/13/2019    Past Surgical History:  Procedure Laterality Date   CESAREAN SECTION N/A 12/08/2019   Procedure: CESAREAN SECTION;  Surgeon: Lavina Hamman, MD;  Location: [facility];  Service: Obstetrics;   Laterality: N/A;   CESAREAN SECTION N/A 09/13/2021   Procedure: CESAREAN SECTION;  Surgeon: Lavina Hamman, MD;  Location: [facility];  Service: Obstetrics;  Laterality: N/A;   NO PAST SURGERIES      OB History     Gravida  2   Para  2   Term  2   Preterm  0   AB  0   Living  2      SAB  0   IAB  0   Ectopic  0   Multiple  0   Live Births  2            Home Medications    Prior to Admission medications   Medication Sig Start Date End Date Taking? Authorizing Provider  hydrOXYzine (ATARAX) 25 MG tablet Take 1 tablet (25 mg total) by mouth every 8 (eight) hours as needed for anxiety. 08/09/23  Yes Susann Givens, Kewon Statler A, NP  ondansetron (ZOFRAN-ODT) 4 MG disintegrating tablet Take 1 tablet (4 mg total) by mouth every 8 (eight) hours as needed for nausea or vomiting. 08/09/23  Yes Susann Givens, Aemilia Dedrick A, NP  benzonatate (TESSALON) 100 MG capsule Take 1 capsule (100 mg total) by mouth every 8 (eight) hours as needed for cough. 08/13/22   Gustavus Bryant, FNP  cetirizine (ZYRTEC) 10 MG tablet Take 1 tablet (10 mg total) by mouth daily. 08/13/22   Gustavus Bryant, FNP  fluticasone (FLONASE) 50 MCG/ACT nasal spray Place 1 spray into both nostrils daily. 08/13/22  [place], Acie Fredrickson, [place]  Prenatal Vit-Fe Fumarate-FA (PRENATAL MULTIVITAMIN) TABS tablet Take 1 tablet by mouth daily.    [provider]  pantoprazole (PROTONIX) 20 MG tablet Take 1 tablet (20 mg total) by mouth daily. 12/07/18 04/09/19  Bethann Berkshire, MD    Family History Family History  Problem Relation Age of Onset   Healthy Mother    Hypertension Mother    Healthy Father    Heart disease Maternal Grandfather    Hyperlipidemia Maternal Grandfather    Stroke Maternal Grandfather    Hyperlipidemia Paternal Grandmother    Hypertension Paternal Grandmother    Hyperlipidemia Paternal Grandfather     Social History Social History   Tobacco Use   Smoking status: Every Day    Current packs/day: 0.00    Types:  Cigarettes    Last attempt to quit: 09/09/2021    Years since quitting: 1.9   Smokeless tobacco: Never  Vaping Use   Vaping status: Never Used  Substance Use Topics   Alcohol use: No    Alcohol/week: 0.0 standard drinks of alcohol   Drug use: No     Allergies   Patient has no known allergies.   Review of Systems Review of Systems  Per HPI  Physical Exam Triage Vital Signs ED Triage Vitals  Encounter Vitals Group     BP 08/09/23 1145 (!) 135/94     Systolic BP Percentile --      Diastolic BP Percentile --      Pulse Rate 08/09/23 1145 (!) 104     Resp 08/09/23 1145 19     Temp 08/09/23 1145 98.4 F (36.9 C)     Temp Source 08/09/23 1145 Oral     SpO2 08/09/23 1145 96 %     Weight 08/09/23 1144 150 lb (68 kg)     Height 08/09/23 1144 5\' 1"  (1.549 m)     Head Circumference --      Peak Flow --      Pain Score 08/09/23 1144 3     Pain Loc --      Pain Education --      Exclude from Growth Chart --    No data found.  Updated Vital Signs BP (!) 135/94 (BP Location: Right Arm)   Pulse (!) 104   Temp 98.4 F (36.9 C) (Oral)   Resp 19   Ht 5\' 1"  (1.549 m)   Wt 150 lb (68 kg)   LMP 07/20/2023   SpO2 96%   Breastfeeding No   BMI 28.34 kg/m   Visual Acuity Right Eye Distance:   Left Eye Distance:   Bilateral Distance:    Right Eye Near:   Left Eye Near:    Bilateral Near:     Physical Exam Vitals and nursing note reviewed.  Constitutional:      General: She is awake. She is not in acute distress.    Appearance: Normal appearance. She is well-developed and well-groomed. She is not ill-appearing.  HENT:     Right Ear: Tympanic membrane, ear canal and external ear normal.     Left Ear: Tympanic membrane, ear canal and external ear normal.     Nose: Nose normal.     Mouth/Throat:     Mouth: Mucous membranes are moist.     Pharynx: Oropharynx is clear.  Eyes:     Extraocular Movements: Extraocular movements intact.     Pupils: Pupils are equal, round,  and reactive to light.  Cardiovascular:  Rate and Rhythm: Regular rhythm. Tachycardia present.  Pulmonary:     Effort: Pulmonary effort is normal.     Breath sounds: Normal breath sounds.  Musculoskeletal:        General: Normal range of motion.  Skin:    General: Skin is warm and dry.  Neurological:     Mental Status: She is alert.  Psychiatric:        Behavior: Behavior is cooperative.      UC Treatments / Results  Labs (all labs ordered are listed, but only abnormal results are displayed) Labs Reviewed  CBC  BASIC METABOLIC PANEL  POCT INFLUENZA A/B  POCT URINE PREGNANCY  POCT FASTING CBG [facility] MANUAL ENTRY    EKG   Radiology No results found.  Procedures Procedures (including critical care time)  Medications Ordered in UC Medications - No data to display  Initial Impression / Assessment and Plan / UC Course  I have reviewed the triage vital signs and the nursing notes.  Pertinent labs & imaging results that were available during my care of the patient were reviewed by me and considered in my medical decision making (see chart for details).     Patient presented with 1 week history of intermittent nausea, vomiting, dizziness, and chest pain.  Patient reports increase in dizzy spells yesterday.  Denies syncope.  Patient also reports history of anxiety and states that she thinks her chest pain is related to this.  No significant findings upon exam.  Mild tachycardia noted upon triage.  EKG revealed normal sinus rhythm without ST elevation, depression, and acute cardiac findings  Flu testing was negative, urine pregnancy was negative, and CBG was normal.  Ordered basic labs to rule out electrolyte imbalance or underlying causes for symptoms.  Prescribe Zofran as needed for nausea/vomiting.  Prescribed hydroxyzine as needed for anxiety.  Discussed follow-up, return, and strict ER precautions Final Clinical Impressions(s) / UC Diagnoses   Final diagnoses:   Nausea and vomiting, unspecified vomiting type  Chest pain, unspecified type  Dizziness  Anxiety     Discharge Instructions      You can take Zofran every 8 hours as needed for nausea and vomiting.  I have also prescribed hydroxyzine that you can take every 8 hours as needed for anxiety  We have drawn some basic labs today and the results should return later.  Someone will call if results are concerning and require immediate treatment.  Otherwise follow-up with primary care provider that you are set up with today in clinic.  Return here as needed.  If you develop severe vomiting or passing out please seek immediate medical treatment in the ER.    ED Prescriptions     Medication Sig Dispense Auth. Provider   ondansetron (ZOFRAN-ODT) 4 MG disintegrating tablet Take 1 tablet (4 mg total) by mouth every 8 (eight) hours as needed for nausea or vomiting. 10 tablet Wynonia Lawman A, NP   hydrOXYzine (ATARAX) 25 MG tablet Take 1 tablet (25 mg total) by mouth every 8 (eight) hours as needed for anxiety. 12 tablet Wynonia Lawman A, NP      PDMP not reviewed this encounter.   Wynonia Lawman A, NP 08/09/23 1459

## 2023-08-09 NOTE — ED Triage Notes (Signed)
 Pt states that she has some nausea, vomiting, dizziness and chest pain. X1 day  Pt states that she chest pain on the upper right part of her chest. Pt states that she has pain that runs down her left arm. Pt states that she normally deals with anxiety but isn't on any medications for it.

## 2023-08-09 NOTE — Discharge Instructions (Signed)
 You can take Zofran every 8 hours as needed for nausea and vomiting.  I have also prescribed hydroxyzine that you can take every 8 hours as needed for anxiety  We have drawn some basic labs today and the results should return later.  Someone will call if results are concerning and require immediate treatment.  Otherwise follow-up with primary care provider that you are set up with today in clinic.  Return here as needed.  If you develop severe vomiting or passing out please seek immediate medical treatment in the ER.

## 2023-08-21 ENCOUNTER — Encounter (HOSPITAL_BASED_OUTPATIENT_CLINIC_OR_DEPARTMENT_OTHER): Payer: Self-pay | Admitting: Student

## 2023-08-21 ENCOUNTER — Encounter (HOSPITAL_BASED_OUTPATIENT_CLINIC_OR_DEPARTMENT_OTHER): Payer: Self-pay

## 2023-08-21 ENCOUNTER — Ambulatory Visit (HOSPITAL_BASED_OUTPATIENT_CLINIC_OR_DEPARTMENT_OTHER): Admitting: Student

## 2023-08-21 VITALS — BP 133/89 | HR 103 | Temp 98.4°F | Ht 61.81 in | Wt 149.9 lb

## 2023-08-21 DIAGNOSIS — Z7689 Persons encountering health services in other specified circumstances: Secondary | ICD-10-CM | POA: Diagnosis not present

## 2023-08-21 DIAGNOSIS — Z720 Tobacco use: Secondary | ICD-10-CM

## 2023-08-21 DIAGNOSIS — F411 Generalized anxiety disorder: Secondary | ICD-10-CM | POA: Diagnosis not present

## 2023-08-21 MED ORDER — ESCITALOPRAM OXALATE 10 MG PO TABS
5.0000 mg | ORAL_TABLET | Freq: Every day | ORAL | 4 refills | Status: DC
Start: 2023-08-21 — End: 2023-09-13

## 2023-08-21 MED ORDER — HYDROXYZINE HCL 25 MG PO TABS
25.0000 mg | ORAL_TABLET | Freq: Four times a day (QID) | ORAL | 2 refills | Status: AC | PRN
Start: 2023-08-21 — End: ?

## 2023-08-21 NOTE — Assessment & Plan Note (Addendum)
 Discussed cessation of use. Patient is agreeable to quitting at this point.

## 2023-08-21 NOTE — Progress Notes (Signed)
 New Patient Office Visit  Subjective    Patient ID: Alexis Hunt, female    DOB: May 09, 1996  Age: 28 y.o. MRN: [id number]  CC:  Chief Complaint  Patient presents with   Anxiety    Anxiety has gotten worse. Youngest son will be 2 in April and his first year was very wacky. Did not enjoy first year with son as did with daughter. Has a lot of regret from that. Had postpartum depression.    Establish Care    Here to establish care. Fell yesterday in laundry room. Hurt left knee. Has a brace on.    Dysmenorrhea    Has been cramping and has taking 2 pregnancy tests but they are both negative. Last test was yesterday.     HPI Alexis Hunt presents to establish care. Prior PCP was unknown. Last physical was a long time ago. she notes that she requires refills of hydroxyzine.  January of last year was last visit with gynecologist.  Tobacco cessation- this is due to anxiety per patient. Discussed cessation. She jsut started back in December and has quit several times in the past. She is agreeable to quitting again.  Anxiety/Depression- Notes that she has emotional lability. Has two toddlers currently and several pets at home. States that she works all day. She feels like most of this has stemmed after the pregnancy. Was drinking daily and binge drinking in the past.     08/21/2023   10:19 AM  GAD 7 : Generalized Anxiety Score  Nervous, Anxious, on Edge 3  Control/stop worrying 3  Worry too much - different things 3  Trouble relaxing 3  Restless 1  Easily annoyed or irritable 3  Afraid - awful might happen 3  Total GAD 7 Score 19  Anxiety Difficulty Extremely difficult      08/21/2023   10:19 AM 04/11/2016    3:55 PM 10/29/2015    8:32 AM  PHQ9 SCORE ONLY  PHQ-9 Total Score 11 0 0   Cramping- For the last few months her periods have been a couple of days late. Not as bad as menstrual cramps but she states that this is how it feels before menstruation. Discussed diet and  stress in relation to regulation of periods.  Screenings:  Colon Cancer: not indicated. Lung Cancer: not indicated. Smoking as above. 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 States that she has never had a normal pap smear. Had colposcopy in the past.  Diabetes: draw in the future. HLD: Draw in the future.  The ASCVD Risk score (Arnett DK, et al., 2019) failed to calculate for the following reasons:   The 2019 ASCVD risk score is only valid for ages 39 to 73   Outpatient Encounter Medications as of 08/21/2023  Medication Sig   escitalopram (LEXAPRO) 10 MG tablet Take 0.5 tablets (5 mg total) by mouth daily.   ondansetron (ZOFRAN-ODT) 4 MG disintegrating tablet Take 1 tablet (4 mg total) by mouth every 8 (eight) hours as needed for nausea or vomiting.   [DISCONTINUED] hydrOXYzine (ATARAX) 25 MG tablet Take 1 tablet (25 mg total) by mouth every 8 (eight) hours as needed for anxiety.   hydrOXYzine (ATARAX) 25 MG tablet Take 1 tablet (25 mg total) by mouth every 6 (six) hours as needed for anxiety.   [DISCONTINUED] benzonatate (TESSALON) 100 MG capsule Take 1 capsule (100 mg total) by mouth every 8 (eight) hours as needed for cough.   [DISCONTINUED] cetirizine (  ZYRTEC) 10 MG tablet Take 1 tablet (10 mg total) by mouth daily.   [DISCONTINUED] fluticasone (FLONASE) 50 MCG/ACT nasal spray Place 1 spray into both nostrils daily.   [DISCONTINUED] pantoprazole (PROTONIX) 20 MG tablet Take 1 tablet (20 mg total) by mouth daily.   [DISCONTINUED] Prenatal Vit-Fe Fumarate-FA (PRENATAL MULTIVITAMIN) TABS tablet Take 1 tablet by mouth daily.   No facility-administered encounter medications on file as of 08/21/2023.    Past Medical History:  Diagnosis Date   Anxiety    Cesarean delivery delivered 12/10/2019   Cesarean delivery delivered 12/10/2019   Concussion    Family history of adverse reaction to anesthesia    dad possible seizures   History of  gestational hypertension    Pregnancy induced hypertension     Past Surgical History:  Procedure Laterality Date   CESAREAN SECTION N/A 12/08/2019   Procedure: CESAREAN SECTION;  Surgeon: Lavina Hamman, MD;  Location: [facility];  Service: Obstetrics;  Laterality: N/A;   CESAREAN SECTION N/A 09/13/2021   Procedure: CESAREAN SECTION;  Surgeon: Lavina Hamman, MD;  Location: [facility];  Service: Obstetrics;  Laterality: N/A;   COLPOSCOPY  06/2022    Family History  Problem Relation Age of Onset   Hypertension Mother    Brain cancer Mother        tumor   Atrial fibrillation Mother    Depression Mother    Hypercholesterolemia Mother    Healthy Father    Seizures Father    Heart disease Maternal Grandfather    Hyperlipidemia Maternal Grandfather    Stroke Maternal Grandfather    Hyperlipidemia Paternal Grandmother    Hypertension Paternal Grandmother    Hyperlipidemia Paternal Grandfather     Social History   Socioeconomic History   Marital status: Single    Spouse name: Not on file   Number of children: 2   Years of education: Not on file   Highest education level: Not on file  Occupational History   Not on file  Tobacco Use   Smoking status: Every Day    Current packs/day: 0.25    Average packs/day: 0.3 packs/day for 9.2 years (2.3 ttl pk-yrs)    Types: Cigarettes    Start date: 2016    Passive exposure: Current   Smokeless tobacco: Never   Tobacco comments:    QUIT SMOKING AND RESTARTED  Vaping Use   Vaping status: Never Used  Substance and Sexual Activity   Alcohol use: Yes    Comment: occasionally   Drug use: No   Sexual activity: Yes    Birth control/protection: None  Other Topics Concern   Not on file  Social History Narrative   Not on file   Social Drivers of Health   Financial Resource Strain: Not on file  Food Insecurity: No Food Insecurity (08/21/2023)   Hunger Vital Sign    Worried About Running Out of Food in the Last Year: Never true     Ran Out of Food in the Last Year: Never true  Transportation Needs: No Transportation Needs (08/21/2023)   PRAPARE - Administrator, Civil Service (Medical): No    Lack of Transportation (Non-Medical): No  Physical Activity: Not on file  Stress: Not on file  Social Connections: Not on file  Intimate Partner Violence: Not At Risk (08/21/2023)   Humiliation, Afraid, Rape, and Kick questionnaire    Fear of Current or Ex-Partner: No    Emotionally Abused: No    Physically Abused: No  Sexually Abused: No    ROS  Per HPI      Objective    BP 133/89 (BP Location: Right Arm, Patient Position: Sitting, Cuff Size: Normal)   Pulse (!) 103   Temp 98.4 F (36.9 C) (Oral)   Ht 5' 1.81" (1.57 m)   Wt 149 lb 14.4 oz (68 kg)   LMP 07/20/2023 (Approximate)   SpO2 98%   BMI 27.58 kg/m   Physical Exam Constitutional:      General: She is not in acute distress.    Appearance: Normal appearance. She is not ill-appearing.  HENT:     Head: Normocephalic and atraumatic.     Right Ear: External ear normal.     Left Ear: External ear normal.     Nose: Nose normal.     Mouth/Throat:     Mouth: Mucous membranes are moist.     Pharynx: Oropharynx is clear.  Eyes:     General: No scleral icterus.    Extraocular Movements: Extraocular movements intact.     Conjunctiva/sclera: Conjunctivae normal.     Pupils: Pupils are equal, round, and reactive to light.  Neck:     Vascular: No carotid bruit.  Cardiovascular:     Rate and Rhythm: Normal rate and regular rhythm.     Pulses: Normal pulses.     Heart sounds: Normal heart sounds. No murmur heard.    No friction rub.  Pulmonary:     Effort: Pulmonary effort is normal. No respiratory distress.     Breath sounds: Normal breath sounds. No wheezing, rhonchi or rales.  Musculoskeletal:     Cervical back: Neck supple.     Right lower leg: No edema.     Left lower leg: No edema.     Comments: Left sided limp due to known knee pain   Skin:    General: Skin is warm and dry.     Coloration: Skin is not jaundiced or pale.  Neurological:     General: No focal deficit present.     Mental Status: She is alert.     Deep Tendon Reflexes: Reflexes normal.  Psychiatric:        Mood and Affect: Mood normal.        Behavior: Behavior normal.         Assessment & Plan:   Encounter to establish care  GAD (generalized anxiety disorder) Assessment & Plan: Ordered hydroxyzine prn Ordered lexapro 5 mg with instructions to increase to 10mg  in about 3 weeks. Discussed SE.  Orders: -     hydrOXYzine HCl; Take 1 tablet (25 mg total) by mouth every 6 (six) hours as needed for anxiety.  Dispense: 30 tablet; Refill: 2 -     Escitalopram Oxalate; Take 0.5 tablets (5 mg total) by mouth daily.  Dispense: 30 tablet; Refill: 4  Tobacco use Assessment & Plan: Discussed cessation of use. Patient is agreeable to quitting at this point.    I have spent greater than 30 minutes charting, educating, diagnosing and managing this patient for this visit.  Return in about 6 weeks (around 10/02/2023) for anxiety check.   Teryl Lucy Avel Ogawa, PA-C

## 2023-08-21 NOTE — Patient Instructions (Signed)
 It was nice to see you today!  As we discussed in clinic:  I want you to begin with a half tablet of the lexapro daily and in 3 weeks begin full tablet of lexapro daily.  Lexapro is an SSRI. SSRI class can have side effects such as weight gain, sexual dysfunction, insomnia, headache, nausea. These medications are generally effective at alleviating symptoms of anxiety and/or depression. Let me know if significant side effects do occur.  If you have any problems before your next visit feel free to message me via MyChart (minor issues or questions) or call the office, otherwise you may reach out to schedule an office visit.  Thank you! Gerilyn Pilgrim Ailanie Ruttan, PA-C

## 2023-08-21 NOTE — Assessment & Plan Note (Signed)
 Ordered hydroxyzine prn Ordered lexapro 5 mg with instructions to increase to 10mg  in about 3 weeks. Discussed SE.

## 2023-09-12 ENCOUNTER — Other Ambulatory Visit (HOSPITAL_BASED_OUTPATIENT_CLINIC_OR_DEPARTMENT_OTHER): Payer: Self-pay | Admitting: Student

## 2023-09-12 DIAGNOSIS — F411 Generalized anxiety disorder: Secondary | ICD-10-CM

## 2023-09-13 ENCOUNTER — Other Ambulatory Visit (HOSPITAL_BASED_OUTPATIENT_CLINIC_OR_DEPARTMENT_OTHER): Payer: Self-pay | Admitting: Student

## 2023-09-13 DIAGNOSIS — R110 Nausea: Secondary | ICD-10-CM

## 2023-09-13 MED ORDER — ONDANSETRON 4 MG PO TBDP
4.0000 mg | ORAL_TABLET | Freq: Three times a day (TID) | ORAL | 0 refills | Status: DC | PRN
Start: 2023-09-13 — End: 2023-10-19

## 2023-10-03 ENCOUNTER — Ambulatory Visit (HOSPITAL_BASED_OUTPATIENT_CLINIC_OR_DEPARTMENT_OTHER): Admitting: Student

## 2023-10-03 ENCOUNTER — Encounter (HOSPITAL_BASED_OUTPATIENT_CLINIC_OR_DEPARTMENT_OTHER): Payer: Self-pay | Admitting: Student

## 2023-10-03 VITALS — BP 137/86 | HR 96 | Temp 98.3°F | Resp 16 | Ht 61.0 in | Wt 145.3 lb

## 2023-10-03 DIAGNOSIS — R110 Nausea: Secondary | ICD-10-CM | POA: Insufficient documentation

## 2023-10-03 DIAGNOSIS — J069 Acute upper respiratory infection, unspecified: Secondary | ICD-10-CM | POA: Insufficient documentation

## 2023-10-03 DIAGNOSIS — F411 Generalized anxiety disorder: Secondary | ICD-10-CM | POA: Diagnosis not present

## 2023-10-03 LAB — POCT RAPID STREP A (OFFICE): Rapid Strep A Screen: NEGATIVE

## 2023-10-03 LAB — POC COVID19 BINAXNOW: SARS Coronavirus 2 Ag: NEGATIVE

## 2023-10-03 MED ORDER — ESCITALOPRAM OXALATE 20 MG PO TABS: 20.0000 mg | ORAL_TABLET | Freq: Every day | ORAL | 5 refills | Status: DC

## 2023-10-03 MED ORDER — PROMETHAZINE-DM 6.25-15 MG/5ML PO SYRP
5.0000 mL | ORAL_SOLUTION | Freq: Four times a day (QID) | ORAL | 0 refills | Status: DC | PRN
Start: 2023-10-03 — End: 2023-11-03

## 2023-10-03 NOTE — Patient Instructions (Addendum)
 It was nice to see you today!  As we discussed in clinic:  Let me know if you need any more of the nausea medication or if your anxiety is worsening or not getting any better.  If you have any problems before your next visit feel free to message me via MyChart (minor issues or questions) or call the office, otherwise you may reach out to schedule an office visit.  Thank you! Arantxa Piercey, PA-C

## 2023-10-03 NOTE — Progress Notes (Signed)
 Established Patient Office Visit  Subjective   Patient ID: Alexis Hunt, female    DOB: 1995/11/11  Age: 28 y.o. MRN: [id number]  Chief Complaint  Patient presents with   Medical Management of Chronic Issues    Here for follow up. Still gets nauseous but better. Wakes up super nauseous if getting up between 7-8am. Otherwise, is fine.   URI    Having flu-like sxs. Started Saturday. Sore throat, runny nose, cough, and sneezing.    HPI  Discussed the use of AI scribe software for clinical note transcription with the patient, who gave verbal consent to proceed.  History of Present Illness   Alexis Hunt is a 28 year old female who presents with persistent nausea and a recent onset of cough.  She experiences persistent nausea that occurs when she wakes up between 7 and 8 AM, but not if she wakes up at 9 AM or later. The nausea is less frequent than before and is associated with anxiety, which has improved since starting Lexapro . She still has Zofran  available for nausea management.  She has been experiencing a persistent cough since Saturday night, which has not improved with over-the-counter medications such as Nyquil, DayQuil, and Tylenol . The cough is described as 'nonstop' with thick, green mucus production, particularly in the morning. No fever or chills are present, but she reports chest pressure and congestion.  She is taking Lexapro  and notes a significant improvement in her anxiety symptoms, feeling less anxious and able to have conversations without crying. She experiences mood swings if she misses a dose. She is currently on a halfway dose.  No bad seasonal allergies are reported, and she does not usually experience symptoms like itchy or watery eyes. She reports a sensation of fullness in her ear, described as 'tunnel hearing'.  In her social history, she is working on cutting down smoking and mentions that smoking marijuana makes her paranoid and nauseous.     Reviewed  AUC note from 08/09/23.   ROS Per HPI.    Objective:     BP 137/86   Pulse 96   Temp 98.3 F (36.8 C) (Oral)   Resp 16   Ht 5\' 1"  (1.549 m)   Wt 145 lb 4.8 oz (65.9 kg)   LMP 09/26/2023 (Exact Date)   SpO2 97%   BMI 27.45 kg/m    Physical Exam Constitutional:      General: She is not in acute distress.    Appearance: Normal appearance. She is not ill-appearing.  HENT:     Head: Normocephalic and atraumatic.     Right Ear: Ear canal and external ear normal.     Left Ear: Tympanic membrane, ear canal and external ear normal.     Ears:     Comments: R TM with water  fluid levels and blunted light reflex. Bony landmarks present. No pus-like contents noted.    Nose: Nose normal.  Eyes:     General: No scleral icterus.    Conjunctiva/sclera: Conjunctivae normal.  Cardiovascular:     Rate and Rhythm: Normal rate and regular rhythm.     Heart sounds: Normal heart sounds. No murmur heard.    No friction rub.  Pulmonary:     Effort: Pulmonary effort is normal. No respiratory distress.     Breath sounds: Normal breath sounds. No wheezing, rhonchi or rales.  Musculoskeletal:        General: Normal range of motion.  Skin:    General: Skin is  warm and dry.     Coloration: Skin is not jaundiced or pale.  Neurological:     General: No focal deficit present.     Mental Status: She is alert.  Psychiatric:        Mood and Affect: Mood normal.        Behavior: Behavior normal.      Results for orders placed or performed in visit on 10/03/23  POC COVID-19 BinaxNow  Result Value Ref Range   SARS Coronavirus 2 Ag Negative Negative  POCT rapid strep A  Result Value Ref Range   Rapid Strep A Screen Negative Negative      The ASCVD Risk score (Arnett DK, et al., 2019) failed to calculate for the following reasons:   The 2019 ASCVD risk score is only valid for ages 3 to 63    Assessment & Plan:   Viral URI Assessment & Plan: Acute cough and congestion since Saturday  night, persistent despite over-the-counter medications. No fever or chills. Productive cough with thick, green sputum suggests possible resolution phase of an upper respiratory infection. No significant facial pressure, but some chest pressure and morning congestion. Possible fluid in the ear causing fullness and tunnel hearing. - Prescribe promethazine  DM syrup for cough, up to four times daily. - Advise against concurrent use of other DM products with promethazine  DM. - Recommend Arm and Hammer saline nasal spray and Flonase  twice daily for one week to aid eustachian tube drainage.  Orders: -     POC COVID-19 BinaxNow -     POCT rapid strep A -     Promethazine -DM; Take 5 mLs by mouth 4 (four) times daily as needed for cough.  Dispense: 118 mL; Refill: 0  GAD (generalized anxiety disorder) Assessment & Plan: Anxiety/Depression symptoms improved but not at goal with current Lexapro  dose, with reduced anxiety and mood swings. She reports feeling better emotionally, though others have not noticed significant change. Patient decided  to increase Lexapro  to 20 mg to assess further improvement. Discussed potential side effects of increased dose, including emotional blunting, insomnia, weight gain, sexual side effects, and nausea. Advised to contact if side effects occur or if emotional blunting is noted. - Increase Lexapro  to 20 mg daily. - Monitor for emotional blunting and other side effects. - Provide 30-day supply of Lexapro , with potential for 90-day supply once dose is stabilized.   Orders: -     Escitalopram  Oxalate; Take 1 tablet (20 mg total) by mouth daily.  Dispense: 30 tablet; Refill: 5  Nausea Assessment & Plan: Stable. Morning nausea, particularly between 7 and 8 AM, absent when waking later. Improved since starting Lexapro , suggesting anxiety-related component. Current upper respiratory infection may contribute due to postnasal drip. - Continue to use  Zofran  as needed for nausea  management.   Return in about 6 months (around 04/03/2024) for Annual Physical.    Dosia Yodice T Deysha Cartier, PA-C

## 2023-10-03 NOTE — Assessment & Plan Note (Addendum)
 Anxiety/Depression symptoms improved but not at goal with current Lexapro  dose, with reduced anxiety and mood swings. She reports feeling better emotionally, though others have not noticed significant change. Patient decided  to increase Lexapro  to 20 mg to assess further improvement. Discussed potential side effects of increased dose, including emotional blunting, insomnia, weight gain, sexual side effects, and nausea. Advised to contact if side effects occur or if emotional blunting is noted. - Increase Lexapro  to 20 mg daily. - Monitor for emotional blunting and other side effects. - Provide 30-day supply of Lexapro , with potential for 90-day supply once dose is stabilized.

## 2023-10-03 NOTE — Assessment & Plan Note (Signed)
 Acute cough and congestion since Saturday night, persistent despite over-the-counter medications. No fever or chills. Productive cough with thick, green sputum suggests possible resolution phase of an upper respiratory infection. No significant facial pressure, but some chest pressure and morning congestion. Possible fluid in the ear causing fullness and tunnel hearing. - Prescribe promethazine  DM syrup for cough, up to four times daily. - Advise against concurrent use of other DM products with promethazine  DM. - Recommend Arm and Hammer saline nasal spray and Flonase  twice daily for one week to aid eustachian tube drainage.

## 2023-10-03 NOTE — Assessment & Plan Note (Addendum)
 Stable. Morning nausea, particularly between 7 and 8 AM, absent when waking later. Improved since starting Lexapro , suggesting anxiety-related component. Current upper respiratory infection may contribute due to postnasal drip. - Continue to use  Zofran  as needed for nausea management.

## 2023-10-19 ENCOUNTER — Other Ambulatory Visit (HOSPITAL_BASED_OUTPATIENT_CLINIC_OR_DEPARTMENT_OTHER): Payer: Self-pay | Admitting: Student

## 2023-10-19 DIAGNOSIS — R110 Nausea: Secondary | ICD-10-CM

## 2023-10-25 ENCOUNTER — Other Ambulatory Visit (HOSPITAL_BASED_OUTPATIENT_CLINIC_OR_DEPARTMENT_OTHER): Payer: Self-pay | Admitting: Student

## 2023-10-25 DIAGNOSIS — F411 Generalized anxiety disorder: Secondary | ICD-10-CM

## 2023-11-03 ENCOUNTER — Encounter (HOSPITAL_BASED_OUTPATIENT_CLINIC_OR_DEPARTMENT_OTHER): Payer: Self-pay | Admitting: Emergency Medicine

## 2023-11-03 ENCOUNTER — Ambulatory Visit (HOSPITAL_BASED_OUTPATIENT_CLINIC_OR_DEPARTMENT_OTHER)
Admission: EM | Admit: 2023-11-03 | Discharge: 2023-11-03 | Disposition: A | Discharge status: Home / Self Care | Attending: Family Medicine | Admitting: Family Medicine

## 2023-11-03 ENCOUNTER — Ambulatory Visit (INDEPENDENT_AMBULATORY_CARE_PROVIDER_SITE_OTHER)
Admit: 2023-11-03 | Discharge: 2023-11-03 | Disposition: A | Discharge status: Home / Self Care | Attending: Family Medicine | Admitting: Family Medicine

## 2023-11-03 ENCOUNTER — Other Ambulatory Visit (HOSPITAL_BASED_OUTPATIENT_CLINIC_OR_DEPARTMENT_OTHER): Payer: Self-pay

## 2023-11-03 DIAGNOSIS — J208 Acute bronchitis due to other specified organisms: Secondary | ICD-10-CM | POA: Diagnosis not present

## 2023-11-03 DIAGNOSIS — R059 Cough, unspecified: Secondary | ICD-10-CM | POA: Diagnosis not present

## 2023-11-03 DIAGNOSIS — R0902 Hypoxemia: Secondary | ICD-10-CM

## 2023-11-03 DIAGNOSIS — R509 Fever, unspecified: Secondary | ICD-10-CM

## 2023-11-03 DIAGNOSIS — R051 Acute cough: Secondary | ICD-10-CM

## 2023-11-03 LAB — POC SARS CORONAVIRUS 2 AG -  ED: SARS Coronavirus 2 Ag: NEGATIVE

## 2023-11-03 MED ORDER — ALBUTEROL SULFATE HFA 108 (90 BASE) MCG/ACT IN AERS
2.0000 | INHALATION_SPRAY | RESPIRATORY_TRACT | 0 refills | Status: DC | PRN
  Filled 2023-11-03: qty 6.7, 25d supply, fill #0

## 2023-11-03 MED ORDER — PROMETHAZINE-DM 6.25-15 MG/5ML PO SYRP
5.0000 mL | ORAL_SOLUTION | Freq: Four times a day (QID) | ORAL | 0 refills | Status: DC | PRN
  Filled 2023-11-03: qty 118, 6d supply, fill #0

## 2023-11-03 MED ORDER — PREDNISONE 20 MG PO TABS
20.0000 mg | ORAL_TABLET | Freq: Every day | ORAL | 0 refills | Status: AC
  Filled 2023-11-03: qty 5, 5d supply, fill #0

## 2023-11-03 MED ORDER — IPRATROPIUM-ALBUTEROL 0.5-2.5 (3) MG/3ML IN SOLN
3.0000 mL | Freq: Once | RESPIRATORY_TRACT | Status: AC
  Administered 2023-11-03: 3 mL via RESPIRATORY_TRACT

## 2023-11-03 MED ORDER — COMPACT SPACE CHAMBER DEVI
0 refills | Status: AC
  Filled 2023-11-03: qty 1, 1d supply, fill #0

## 2023-11-03 NOTE — Discharge Instructions (Addendum)
 Rapid COVID was negative.  Chest x-ray is slightly hazy but no consolidation.  This indicates bronchitis but not pneumonia.  Oxygen saturation improved significantly after the nebulizer treatment.  Diagnosis is acute viral bronchitis with fever and hypoxia.  Prednisone 20 mg daily for 5 days.  Albuterol inhaler with spacer, 2 puffs every 4 hours as needed for wheezing.  Please use this inhaler at least 2 or 3 times daily for the next 4 days due to low oxygen levels here at the office.  Promethazine  DM, 5 mL, every 6 hours as needed for cough.  Get plenty of fluids and rest.  Needs a recheck with primary care in 2 to 3 weeks to make sure everything resolved and the bronchitis did not progress and become pneumonia.  Could recheck here if not able to be seen at primary care.

## 2023-11-03 NOTE — ED Provider Notes (Signed)
 [facility]    CSN: [id number] Arrival date & time: 11/03/23  0836      History   Chief Complaint Chief Complaint  Patient presents with   Cough    HPI Alexis Hunt is a 28 y.o. female.   Patient reports that on Tuesday, 10/31/2023, she had a cough and some nasal congestion.  She could feel mucus in her throat but she could never get it up.  During the night of 11/02/2023, she had nasal congestion, body aches and fever.  She has used over-the-counter cough drops, acetaminophen , naproxen sodium, NyQuil.  All of those medicines helped the fever and the body aches but did not help the cough at all.  She aches all over, is coughing constantly and has had intermittent fever for over 24 hours.   Cough Associated symptoms: chills, fever and rhinorrhea   Associated symptoms: no chest pain, no ear pain, no rash, no shortness of breath and no sore throat     Past Medical History:  Diagnosis Date   Anxiety    Cesarean delivery delivered 12/10/2019   Cesarean delivery delivered 12/10/2019   Concussion    Family history of adverse reaction to anesthesia    dad possible seizures   History of gestational hypertension    Pregnancy induced hypertension     Patient Active Problem List   Diagnosis Date Noted   Viral URI 10/03/2023   Nausea 10/03/2023   Tobacco use 08/21/2023   Acute stress disorder 08/12/2022   Numbness of finger 08/11/2022   S/P cesarean section 09/13/2021   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   GAD (generalized anxiety disorder) 05/13/2019    Past Surgical History:  Procedure Laterality Date   CESAREAN SECTION N/A 12/08/2019   Procedure: CESAREAN SECTION;  Surgeon: Cyd Dowse, MD;  Location: [facility];  Service: Obstetrics;  Laterality: N/A;   CESAREAN SECTION N/A 09/13/2021   Procedure: CESAREAN SECTION;  Surgeon: Cyd Dowse, MD;  Location: [facility];  Service: Obstetrics;  Laterality: N/A;   COLPOSCOPY  06/2022    OB History     Gravida   2   Para  2   Term  2   Preterm  0   AB  0   Living  2      SAB  0   IAB  0   Ectopic  0   Multiple  0   Live Births  2            Home Medications    Prior to Admission medications   Medication Sig Start Date End Date Taking? Authorizing Provider  albuterol  (VENTOLIN  HFA) 108 (90 Base) MCG/ACT inhaler Inhale 2 puffs into the lungs every 4 (four) hours as needed for wheezing or shortness of breath. 11/03/23  Yes Guss Legacy, FNP  predniSONE  (DELTASONE ) 20 MG tablet Take 1 tablet (20 mg total) by mouth daily with breakfast for 5 days. 11/03/23 11/08/23 Yes Guss Legacy, FNP  promethazine -dextromethorphan (PROMETHAZINE -DM) 6.25-15 MG/5ML syrup Take 5 mLs by mouth 4 (four) times daily as needed for cough. Do not use and drive - May make drowsy. 11/03/23  Yes Guss Legacy, FNP  Spacer/Aero-Holding Chambers (COMPACT SPACE CHAMBER) DEVI Use with the albuterol  inhaler 11/03/23  Yes Guss Legacy, FNP  acetaminophen  (TYLENOL ) 500 MG tablet Take 1,000 mg by mouth as needed.    [provider]  escitalopram  (LEXAPRO ) 20 MG tablet TAKE 1 TABLET BY MOUTH EVERY DAY 10/26/23   Rothfuss, Jacob T, PA-C  hydrOXYzine  (ATARAX ) 25 MG tablet Take 1 tablet (25 mg total) by mouth every 6 (six) hours as needed for anxiety. 08/21/23   Rothfuss, Jacob T, PA-C  pantoprazole  (PROTONIX ) 20 MG tablet Take 1 tablet (20 mg total) by mouth daily. Patient not taking: Reported on 10/03/2023 12/07/18 04/09/19  Cheyenne Cotta, MD    Family History Family History  Problem Relation Age of Onset   Hypertension Mother    Brain cancer Mother        tumor   Atrial fibrillation Mother    Depression Mother    Hypercholesterolemia Mother    Healthy Father    Seizures Father    Heart disease Maternal Grandfather    Hyperlipidemia Maternal Grandfather    Stroke Maternal Grandfather    Hyperlipidemia Paternal Grandmother    Hypertension Paternal Grandmother    Hyperlipidemia Paternal Grandfather      Social History Social History   Tobacco Use   Smoking status: Every Day    Current packs/day: 0.25    Average packs/day: 0.3 packs/day for 9.4 years (2.4 ttl pk-yrs)    Types: Cigarettes    Start date: 2016    Passive exposure: Current   Smokeless tobacco: Never   Tobacco comments:    QUIT SMOKING AND RESTARTED  Vaping Use   Vaping status: Never Used  Substance Use Topics   Alcohol use: Yes    Comment: occasionally   Drug use: No     Allergies   Patient has no known allergies.   Review of Systems Review of Systems  Constitutional:  Positive for chills and fever.  HENT:  Positive for congestion, postnasal drip and rhinorrhea. Negative for ear pain and sore throat.   Eyes:  Negative for pain and visual disturbance.  Respiratory:  Positive for cough. Negative for shortness of breath.   Cardiovascular:  Negative for chest pain and palpitations.  Gastrointestinal:  Negative for abdominal pain, constipation, diarrhea, nausea and vomiting.  Genitourinary:  Negative for dysuria and hematuria.  Musculoskeletal:  Positive for arthralgias. Negative for back pain.  Skin:  Negative for color change and rash.  Neurological:  Negative for seizures and syncope.  All other systems reviewed and are negative.    Physical Exam Triage Vital Signs ED Triage Vitals  Encounter Vitals Group     BP 11/03/23 0850 137/87     Systolic BP Percentile --      Diastolic BP Percentile --      Pulse Rate 11/03/23 0850 (!) 105     Resp 11/03/23 0850 18     Temp 11/03/23 0850 99 F (37.2 C)     Temp src --      SpO2 11/03/23 0850 92 %     Weight --      Height --      Head Circumference --      Peak Flow --      Pain Score 11/03/23 0848 4     Pain Loc --      Pain Education --      Exclude from Growth Chart --    No data found.  Updated Vital Signs BP 137/87 (BP Location: Right Arm)   Pulse 98   Temp 99 F (37.2 C)   Resp 18   LMP 10/26/2023 (Approximate)   SpO2 98%    Visual Acuity Right Eye Distance:   Left Eye Distance:   Bilateral Distance:    Right Eye Near:   Left Eye Near:  Bilateral Near:     Physical Exam Vitals and nursing note reviewed.  Constitutional:      General: She is not in acute distress.    Appearance: She is well-developed. She is ill-appearing. She is not toxic-appearing.  HENT:     Head: Normocephalic and atraumatic.     Right Ear: Hearing, tympanic membrane, ear canal and external ear normal.     Left Ear: Hearing, tympanic membrane, ear canal and external ear normal.     Nose: Mucosal edema, congestion and rhinorrhea present. Rhinorrhea is clear.     Right Sinus: No maxillary sinus tenderness or frontal sinus tenderness.     Left Sinus: No maxillary sinus tenderness or frontal sinus tenderness.     Mouth/Throat:     Lips: Pink.     Mouth: Mucous membranes are moist.     Pharynx: Uvula midline. No oropharyngeal exudate or posterior oropharyngeal erythema.     Tonsils: No tonsillar exudate.  Eyes:     Conjunctiva/sclera: Conjunctivae normal.     Pupils: Pupils are equal, round, and reactive to light.  Cardiovascular:     Rate and Rhythm: Normal rate and regular rhythm.     Heart sounds: S1 normal and S2 normal. No murmur heard. Pulmonary:     Effort: Pulmonary effort is normal. No respiratory distress.     Breath sounds: Examination of the right-upper field reveals wheezing. Examination of the left-upper field reveals wheezing. Examination of the right-middle field reveals wheezing. Examination of the left-middle field reveals wheezing. Wheezing present. No decreased breath sounds, rhonchi or rales.     Comments: Reassessment after DuoNeb treatment: Oxygen saturation was 92% on room air initially.  After DuoNeb treatment oxygen saturation was 97 to 98% on room air.  Breath sounds were clear no wheezing after the DuoNeb treatment. Abdominal:     General: Bowel sounds are normal.     Palpations: Abdomen is soft.      Tenderness: There is no abdominal tenderness.  Musculoskeletal:        General: No swelling.     Cervical back: Neck supple.  Lymphadenopathy:     Head:     Right side of head: No submental, submandibular, tonsillar, preauricular or posterior auricular adenopathy.     Left side of head: No submental, submandibular, tonsillar, preauricular or posterior auricular adenopathy.     Cervical: No cervical adenopathy.     Right cervical: No superficial cervical adenopathy.    Left cervical: No superficial cervical adenopathy.  Skin:    General: Skin is warm and dry.     Capillary Refill: Capillary refill takes less than 2 seconds.     Findings: No rash.  Neurological:     Mental Status: She is alert and oriented to person, place, and time.  Psychiatric:        Mood and Affect: Mood normal.      UC Treatments / Results  Labs (all labs ordered are listed, but only abnormal results are displayed) Labs Reviewed  POC SARS CORONAVIRUS 2 AG -  ED    EKG   Radiology No results found.  Procedures Procedures (including critical care time)  Medications Ordered in UC Medications  ipratropium-albuterol  (DUONEB) 0.5-2.5 (3) MG/3ML nebulizer solution 3 mL (3 mLs Nebulization Given 11/03/23 0921)    Initial Impression / Assessment and Plan / UC Course  I have reviewed the triage vital signs and the nursing notes.  Pertinent labs & imaging results that were available during my care  of the patient were reviewed by me and considered in my medical decision making (see chart for details).  Plan of Care: Acute viral bronchitis: Chest x-ray appeared clear of pneumonia.  COVID was negative.  Prednisone  20 mg daily for 5 days.  Albuterol  inhaler with spacer, 2 puffs every 4 hours as needed for wheezing.  Promethazine  DM, 5 mL, every 6 hours as needed for cough.  Get plenty of fluids and rest.  Needs recheck in 2 to 3 weeks to be sure the bronchitis did not become pneumonia.  Should recheck with  primary care or return here.  May return sooner if needed.  I reviewed the plan of care with the patient and/or the patient's guardian.  The patient and/or guardian had time to ask questions and acknowledged that the questions were answered.  I provided instruction on symptoms or reasons to return here or to go to an ER, if symptoms/condition did not improve, worsened or if new symptoms occurred.  Final Clinical Impressions(s) / UC Diagnoses   Final diagnoses:  Acute cough  Fever, unspecified  Hypoxia  Acute viral bronchitis     Discharge Instructions      Rapid COVID was negative.  Chest x-ray is slightly hazy but no consolidation.  This indicates bronchitis but not pneumonia.  Oxygen saturation improved significantly after the nebulizer treatment.  Diagnosis is acute viral bronchitis with fever and hypoxia.  Prednisone  20 mg daily for 5 days.  Albuterol  inhaler with spacer, 2 puffs every 4 hours as needed for wheezing.  Please use this inhaler at least 2 or 3 times daily for the next 4 days due to low oxygen levels here at the office.  Promethazine  DM, 5 mL, every 6 hours as needed for cough.  Get plenty of fluids and rest.  Needs a recheck with primary care in 2 to 3 weeks to make sure everything resolved and the bronchitis did not progress and become pneumonia.  Could recheck here if not able to be seen at primary care.   ED Prescriptions     Medication Sig Dispense Auth. Provider   predniSONE  (DELTASONE ) 20 MG tablet Take 1 tablet (20 mg total) by mouth daily with breakfast for 5 days. 5 tablet Braven Wolk, FNP   albuterol  (VENTOLIN  HFA) 108 (90 Base) MCG/ACT inhaler Inhale 2 puffs into the lungs every 4 (four) hours as needed for wheezing or shortness of breath. 6.7 g Guss Legacy, FNP   Spacer/Aero-Holding Chambers (COMPACT SPACE CHAMBER) DEVI Use with the albuterol  inhaler 1 each Guss Legacy, FNP   promethazine -dextromethorphan (PROMETHAZINE -DM) 6.25-15 MG/5ML syrup Take 5  mLs by mouth 4 (four) times daily as needed for cough. Do not use and drive - May make drowsy. 118 mL Guss Legacy, FNP      PDMP not reviewed this encounter.   Guss Legacy, FNP 11/03/23 1022

## 2023-11-03 NOTE — ED Triage Notes (Signed)
 Tuesday started having cough. Reports mucous will come in to throat but wont come up enough to get out. Reports last night having congestion, body aches, chills. Tried cough drops, Tylenol , aleve, Nyquil that will help body aches and chills but not the cough.

## 2023-11-04 ENCOUNTER — Ambulatory Visit (HOSPITAL_BASED_OUTPATIENT_CLINIC_OR_DEPARTMENT_OTHER): Payer: Self-pay | Admitting: Family Medicine

## 2023-11-04 NOTE — Progress Notes (Signed)
 Patient updated.  Chest X-Ray was negative.  She is some better with use of the inhaler and the prednisone .

## 2023-12-12 LAB — HM PAP SMEAR: HM Pap smear: POSITIVE

## 2023-12-21 ENCOUNTER — Encounter (HOSPITAL_BASED_OUTPATIENT_CLINIC_OR_DEPARTMENT_OTHER): Payer: Self-pay | Admitting: Emergency Medicine

## 2023-12-21 ENCOUNTER — Ambulatory Visit (HOSPITAL_BASED_OUTPATIENT_CLINIC_OR_DEPARTMENT_OTHER)
Admission: EM | Admit: 2023-12-21 | Discharge: 2023-12-21 | Disposition: A | Discharge status: Home / Self Care | Attending: Family Medicine | Admitting: Family Medicine

## 2023-12-21 ENCOUNTER — Other Ambulatory Visit (HOSPITAL_BASED_OUTPATIENT_CLINIC_OR_DEPARTMENT_OTHER): Payer: Self-pay

## 2023-12-21 DIAGNOSIS — M79621 Pain in right upper arm: Secondary | ICD-10-CM | POA: Diagnosis not present

## 2023-12-21 DIAGNOSIS — T63461A Toxic effect of venom of wasps, accidental (unintentional), initial encounter: Secondary | ICD-10-CM

## 2023-12-21 DIAGNOSIS — M25521 Pain in right elbow: Secondary | ICD-10-CM

## 2023-12-21 MED ORDER — EPINEPHRINE 0.3 MG/0.3ML IJ SOAJ: 0.3000 mg | INTRAMUSCULAR | 2 refills | Status: AC | PRN

## 2023-12-21 MED ORDER — EPINEPHRINE 0.3 MG/0.3ML IJ SOAJ
0.3000 mg | INTRAMUSCULAR | 2 refills | Status: DC | PRN
  Filled 2023-12-21: qty 1, fill #0

## 2023-12-21 MED ORDER — PREDNISONE 20 MG PO TABS
20.0000 mg | ORAL_TABLET | Freq: Every day | ORAL | 0 refills | Status: AC
  Filled 2023-12-21 (×2): qty 5, 5d supply, fill #0

## 2023-12-21 NOTE — ED Provider Notes (Signed)
 [name]    CSN: [id number] Arrival date & time: 12/21/23  1628      History   Chief Complaint No chief complaint on file.   HPI Alexis Hunt is a 28 y.o. female.    Pt reports she was stung by a bee on Tuesday 07/15 on her left elbow, pt arm is red and swollen.        Past Medical History:  Diagnosis Date   Anxiety    Cesarean delivery delivered 12/10/2019   Cesarean delivery delivered 12/10/2019   Concussion    Family history of adverse reaction to anesthesia    dad possible seizures   History of gestational hypertension    Pregnancy induced hypertension     Patient Active Problem List   Diagnosis Date Noted   Viral URI 10/03/2023   Nausea 10/03/2023   Tobacco use 08/21/2023   Acute stress disorder 08/12/2022   Numbness of finger 08/11/2022   S/P cesarean section 09/13/2021   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   GAD (generalized anxiety disorder) 05/13/2019    Past Surgical History:  Procedure Laterality Date   CESAREAN SECTION N/A 12/08/2019   Procedure: CESAREAN SECTION;  Surgeon: Horacio Boas, MD;  Location: [facility];  Service: Obstetrics;  Laterality: N/A;   CESAREAN SECTION N/A 09/13/2021   Procedure: CESAREAN SECTION;  Surgeon: Horacio Boas, MD;  Location: [facility];  Service: Obstetrics;  Laterality: N/A;   COLPOSCOPY  06/2022    OB History     Gravida  2   Para  2   Term  2   Preterm  0   AB  0   Living  2      SAB  0   IAB  0   Ectopic  0   Multiple  0   Live Births  2            Home Medications    Prior to Admission medications   Medication Sig Start Date End Date Taking? Authorizing Provider  predniSONE  (DELTASONE ) 20 MG tablet Take 1 tablet (20 mg total) by mouth daily with breakfast for 5 days. 12/21/23 12/26/23 Yes Ival Domino, FNP  acetaminophen  (TYLENOL ) 500 MG tablet Take 1,000 mg by mouth as needed.    [provider]  albuterol  (VENTOLIN  HFA) 108 (90 Base) MCG/ACT inhaler  Inhale 2 puffs into the lungs every 4 (four) hours as needed for wheezing or shortness of breath. 11/03/23   Ival Domino, FNP  EPINEPHrine  (EPIPEN  2-PAK) 0.3 mg/0.3 mL IJ SOAJ injection Inject 0.3 mg into the muscle as needed for anaphylaxis. 12/21/23   Ival Domino, FNP  escitalopram  (LEXAPRO ) 20 MG tablet TAKE 1 TABLET BY MOUTH EVERY DAY 10/26/23   Rothfuss, Jacob T, PA-C  hydrOXYzine  (ATARAX ) 25 MG tablet Take 1 tablet (25 mg total) by mouth every 6 (six) hours as needed for anxiety. 08/21/23   Rothfuss, Jacob T, PA-C  promethazine -dextromethorphan (PROMETHAZINE -DM) 6.25-15 MG/5ML syrup Take 5 mLs by mouth 4 (four) times daily as needed for cough. Do not use and drive - May make drowsy. 11/03/23   Ival Domino, FNP  Spacer/Aero-Holding Chambers (COMPACT SPACE CHAMBER) DEVI Use with the albuterol  inhaler 11/03/23   Ival Domino, FNP  pantoprazole  (PROTONIX ) 20 MG tablet Take 1 tablet (20 mg total) by mouth daily. Patient not taking: Reported on 10/03/2023 12/07/18 04/09/19  Suzette Pac, MD    Family History Family History  Problem Relation Age of Onset   Hypertension Mother  Brain cancer Mother        tumor   Atrial fibrillation Mother    Depression Mother    Hypercholesterolemia Mother    Healthy Father    Seizures Father    Heart disease Maternal Grandfather    Hyperlipidemia Maternal Grandfather    Stroke Maternal Grandfather    Hyperlipidemia Paternal Grandmother    Hypertension Paternal Grandmother    Hyperlipidemia Paternal Grandfather     Social History Social History   Tobacco Use   Smoking status: Every Day    Current packs/day: 0.25    Average packs/day: 0.3 packs/day for 9.5 years (2.4 ttl pk-yrs)    Types: Cigarettes    Start date: 2016    Passive exposure: Current   Smokeless tobacco: Never   Tobacco comments:    QUIT SMOKING AND RESTARTED  Vaping Use   Vaping status: Never Used  Substance Use Topics   Alcohol use: Yes    Comment: occasionally   Drug use:  No     Allergies   Patient has no known allergies.   Review of Systems Review of Systems  Constitutional:  Negative for fever.  Respiratory:  Negative for cough.   Cardiovascular:  Negative for chest pain.  Gastrointestinal:  Negative for abdominal pain, constipation, diarrhea, nausea and vomiting.  Musculoskeletal:  Positive for joint swelling (Left elbow pain and swelling from a bee sting.). Negative for arthralgias and back pain.  Skin:  Negative for color change and rash.  Neurological:  Negative for syncope.  All other systems reviewed and are negative.    Physical Exam Triage Vital Signs ED Triage Vitals  Encounter Vitals Group     BP 12/21/23 1640 116/80     Girls Systolic BP Percentile --      Girls Diastolic BP Percentile --      Boys Systolic BP Percentile --      Boys Diastolic BP Percentile --      Pulse Rate 12/21/23 1640 94     Resp 12/21/23 1640 18     Temp 12/21/23 1640 98.5 F (36.9 C)     Temp Source 12/21/23 1640 Oral     SpO2 12/21/23 1640 97 %     Weight --      Height --      Head Circumference --      Peak Flow --      Pain Score 12/21/23 1639 6     Pain Loc --      Pain Education --      Exclude from Growth Chart --    No data found.  Updated Vital Signs BP 116/80 (BP Location: Right Arm)   Pulse 94   Temp 98.5 F (36.9 C) (Oral)   Resp 18   LMP 11/25/2023   SpO2 97%   Visual Acuity Right Eye Distance:   Left Eye Distance:   Bilateral Distance:    Right Eye Near:   Left Eye Near:    Bilateral Near:     Physical Exam Vitals and nursing note reviewed.  Constitutional:      General: She is not in acute distress.    Appearance: She is well-developed. She is not ill-appearing or toxic-appearing.  HENT:     Head: Normocephalic and atraumatic.     Right Ear: External ear normal.     Left Ear: External ear normal.     Nose: Nose normal.     Mouth/Throat:     Lips: Pink.  Mouth: Mucous membranes are moist.  Eyes:      Conjunctiva/sclera: Conjunctivae normal.     Pupils: Pupils are equal, round, and reactive to light.  Cardiovascular:     Rate and Rhythm: Normal rate and regular rhythm.     Heart sounds: S1 normal and S2 normal. No murmur heard. Pulmonary:     Effort: Pulmonary effort is normal. No respiratory distress.     Breath sounds: Normal breath sounds. No decreased breath sounds, wheezing, rhonchi or rales.  Musculoskeletal:        General: No swelling.     Right shoulder: Normal.     Left shoulder: Normal.     Right upper arm: Tenderness (and some mild pain with  movement) present. No swelling, edema, deformity, lacerations or bony tenderness.     Left upper arm: Normal.     Right elbow: Swelling (At site of sting) present. No deformity, effusion or lacerations. Decreased range of motion (Due to pain). Tenderness (At site of bee sting) present.     Left elbow: Normal.     Right forearm: Normal.     Left forearm: Normal.  Skin:    General: Skin is warm and dry.     Capillary Refill: Capillary refill takes less than 2 seconds.     Findings: Wound (Bee sting or wasp sting on the right medial elbow with significant erythema.  See photo for more information.  There is a circle with magic marker.  That was the redness from last night but it is more than doubled since last night.) present. No rash.  Neurological:     Mental Status: She is alert and oriented to person, place, and time.  Psychiatric:        Mood and Affect: Mood normal.      UC Treatments / Results  Labs (all labs ordered are listed, but only abnormal results are displayed) Labs Reviewed - No data to display  EKG   Radiology No results found.  Procedures Procedures (including critical care time)  Medications Ordered in UC Medications - No data to display  Initial Impression / Assessment and Plan / UC Course  I have reviewed the triage vital signs and the nursing notes.  Pertinent labs & imaging results that were  available during my care of the patient were reviewed by me and considered in my medical decision making (see chart for details).  Plan of Care: Right elbow/arm pain and allergic reaction to bee sting: Prednisone  20 mg 1 daily for 5 days.  Provided prescription for an EpiPen  and instructions on how to use.  Encouraged to get an EpiPen  for future as this was a pretty significant reaction.  Follow-up as needed.  I reviewed the plan of care with the patient and/or the patient's guardian.  The patient and/or guardian had time to ask questions and acknowledged that the questions were answered.  I provided instruction on symptoms or reasons to return here or to go to an ER, if symptoms/condition did not improve, worsened or if new symptoms occurred.  Final Clinical Impressions(s) / UC Diagnoses   Final diagnoses:  Allergic reaction to wasp sting  Right elbow pain  Pain of right upper arm     Discharge Instructions      Localized reaction to wasp or bee sting and pain at the site: Prednisone  20 mg 1 daily for 5 days.  EpiPen  prescription provided.  Provided handouts on how to use the EpiPen .  Follow-up if symptoms do  not improve, worsen or new symptoms occur     ED Prescriptions     Medication Sig Dispense Auth. Provider   predniSONE  (DELTASONE ) 20 MG tablet Take 1 tablet (20 mg total) by mouth daily with breakfast for 5 days. 5 tablet Bailynn Dyk, FNP   EPINEPHrine  (EPIPEN  2-PAK) 0.3 mg/0.3 mL IJ SOAJ injection  (Status: Discontinued) Inject 0.3 mg into the muscle as needed for anaphylaxis. 1 each Ival Domino, FNP   EPINEPHrine  (EPIPEN  2-PAK) 0.3 mg/0.3 mL IJ SOAJ injection Inject 0.3 mg into the muscle as needed for anaphylaxis. 1 each Ival Domino, FNP      PDMP not reviewed this encounter.   Ival Domino, FNP 12/21/23 1742

## 2023-12-21 NOTE — ED Triage Notes (Signed)
 Pt reports she was stung by a bee on Tuesday 07/15 on her left elbow, pt arm is red and swollen.

## 2023-12-21 NOTE — Discharge Instructions (Addendum)
 Localized reaction to wasp or bee sting and pain at the site: Prednisone  20 mg 1 daily for 5 days.  EpiPen  prescription provided.  Provided handouts on how to use the EpiPen .  Follow-up if symptoms do not improve, worsen or new symptoms occur

## 2024-03-08 ENCOUNTER — Encounter (HOSPITAL_BASED_OUTPATIENT_CLINIC_OR_DEPARTMENT_OTHER): Payer: Self-pay

## 2024-04-03 ENCOUNTER — Encounter (HOSPITAL_BASED_OUTPATIENT_CLINIC_OR_DEPARTMENT_OTHER): Payer: Self-pay | Admitting: Student

## 2024-04-03 ENCOUNTER — Ambulatory Visit (INDEPENDENT_AMBULATORY_CARE_PROVIDER_SITE_OTHER): Admitting: Student

## 2024-04-03 VITALS — BP 120/74 | HR 102 | Temp 98.4°F | Resp 16 | Ht 61.0 in | Wt 146.4 lb

## 2024-04-03 DIAGNOSIS — Z3A18 18 weeks gestation of pregnancy: Secondary | ICD-10-CM

## 2024-04-03 DIAGNOSIS — Z0000 Encounter for general adult medical examination without abnormal findings: Secondary | ICD-10-CM | POA: Diagnosis not present

## 2024-04-03 DIAGNOSIS — O99332 Smoking (tobacco) complicating pregnancy, second trimester: Secondary | ICD-10-CM | POA: Diagnosis not present

## 2024-04-03 MED ORDER — ALBUTEROL SULFATE HFA 108 (90 BASE) MCG/ACT IN AERS: 2.0000 | INHALATION_SPRAY | RESPIRATORY_TRACT | 2 refills | Status: DC | PRN

## 2024-04-03 NOTE — Progress Notes (Signed)
 Complete physical exam  Patient: Alexis Hunt   DOB: April 12, 1996   28 y.o. Female  MRN: [id number]  Subjective:    Chief Complaint  Patient presents with   Annual Exam    Annual exam. Last LMP was 11/25/2023. Is [redacted] weeks pregnant.     Discussed the use of AI scribe software for clinical note transcription with the patient, who gave verbal consent to proceed.  History of Present Illness   Alexis Hunt is a 28 year old female who presents for her annual exam at 18 weeks of pregnancy.  She is currently 18 weeks and a few days pregnant, with a due date of March 28th. This is her third pregnancy, and she has a history of two previous C-sections. She experiences round ligament pain, which is more pronounced in this pregnancy compared to her previous ones, but she remains active by walking regularly. She has gained approximately three pounds since the start of her pregnancy. No significant nausea is reported except when exposed to unpleasant smells.  She has significantly reduced her smoking to about three cigarettes a day and is actively working on quitting. She has tried various methods such as gum, snacking, and taking breaks outside. She is actively working on quitting smoking and has reduced her use to about three cigarettes a day.  She has a history of depression and anxiety but is no longer taking hydroxyzine  or Lexapro . She tapered off Lexapro  by reducing the dose from one tablet daily to half before stopping. She reports no side effects from discontinuation and notes a significant improvement in her depression and anxiety scores since March.  She has not visited the dentist in a while but had an eye exam in June. No history of thyroid issues. She experiences frequent urination but is otherwise sleeping well.      Most recent fall risk assessment:    04/11/2016    3:55 PM  Fall Risk   Falls in the past year? No      Data saved with a previous flowsheet row definition      Most recent depression screenings:    04/03/2024    9:49 AM 08/21/2023   10:19 AM  PHQ 2/9 Scores  PHQ - 2 Score 0 3  PHQ- 9 Score 4 11      04/03/2024    9:51 AM 08/21/2023   10:19 AM  GAD 7 : Generalized Anxiety Score  Nervous, Anxious, on Edge 1 3  Control/stop worrying 0 3  Worry too much - different things 0 3  Trouble relaxing 0 3  Restless 0 1  Easily annoyed or irritable 1 3  Afraid - awful might happen 0 3  Total GAD 7 Score 2 19  Anxiety Difficulty Not difficult at all Extremely difficult   Patient Active Problem List   Diagnosis Date Noted   Viral URI 10/03/2023   Nausea 10/03/2023   Tobacco use 08/21/2023   Acute stress disorder 08/12/2022   Numbness of finger 08/11/2022   S/P cesarean section 09/13/2021   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   GAD (generalized anxiety disorder) 05/13/2019   Past Medical History:  Diagnosis Date   Anxiety    Cesarean delivery delivered 12/10/2019   Cesarean delivery delivered 12/10/2019   Concussion    Family history of adverse reaction to anesthesia    dad possible seizures   History of gestational hypertension    Pregnancy induced hypertension    Social History   Tobacco Use  Smoking status: Every Day    Current packs/day: 0.25    Average packs/day: 0.3 packs/day for 9.8 years (2.5 ttl pk-yrs)    Types: Cigarettes    Start date: 2016    Passive exposure: Current   Smokeless tobacco: Never   Tobacco comments:    QUIT SMOKING AND RESTARTED  Vaping Use   Vaping status: Never Used  Substance Use Topics   Alcohol use: Not Currently    Comment: occasionally   Drug use: No   No Known Allergies    Patient Care Team: Teia Freitas T, PA-C as PCP - General (Physician Assistant)   Outpatient Medications Prior to Visit  Medication Sig   acetaminophen  (TYLENOL ) 500 MG tablet Take 1,000 mg by mouth as needed.   EPINEPHrine  (EPIPEN  2-PAK) 0.3 mg/0.3 mL IJ SOAJ injection Inject 0.3 mg into the muscle as needed for  anaphylaxis.   Prenatal Vit-DSS-Fe Cbn-FA (PRENATAL AD PO) 2 GUMMIES DAILY   Spacer/Aero-Holding Chambers (COMPACT SPACE CHAMBER) DEVI Use with the albuterol  inhaler   [DISCONTINUED] albuterol  (VENTOLIN  HFA) 108 (90 Base) MCG/ACT inhaler Inhale 2 puffs into the lungs every 4 (four) hours as needed for wheezing or shortness of breath.   [DISCONTINUED] promethazine -dextromethorphan (PROMETHAZINE -DM) 6.25-15 MG/5ML syrup Take 5 mLs by mouth 4 (four) times daily as needed for cough. Do not use and drive - May make drowsy.   escitalopram  (LEXAPRO ) 20 MG tablet TAKE 1 TABLET BY MOUTH EVERY DAY (Patient not taking: Reported on 04/03/2024)   hydrOXYzine  (ATARAX ) 25 MG tablet Take 1 tablet (25 mg total) by mouth every 6 (six) hours as needed for anxiety. (Patient not taking: Reported on 04/03/2024)   No facility-administered medications prior to visit.    ROS  Per HPI     Objective:     BP 120/74   Pulse (!) 102   Temp 98.4 F (36.9 C) (Oral)   Resp 16   Ht 5' 1 (1.549 m)   Wt 146 lb 6.4 oz (66.4 kg)   LMP 11/25/2023 (Exact Date)   SpO2 97%   BMI 27.66 kg/m  BP Readings from Last 3 Encounters:  04/03/24 120/74  12/21/23 116/80  11/03/23 137/87   Wt Readings from Last 3 Encounters:  04/03/24 146 lb 6.4 oz (66.4 kg)  10/03/23 145 lb 4.8 oz (65.9 kg)  08/21/23 149 lb 14.4 oz (68 kg)      Physical Exam Constitutional:      General: She is not in acute distress.    Appearance: Normal appearance. She is not ill-appearing or diaphoretic.  HENT:     Head: Normocephalic and atraumatic.     Right Ear: Tympanic membrane, ear canal and external ear normal.     Left Ear: Tympanic membrane, ear canal and external ear normal.     Nose: Nose normal.     Mouth/Throat:     Mouth: Mucous membranes are moist.     Pharynx: Oropharynx is clear.  Eyes:     General: No scleral icterus.       Right eye: No discharge.        Left eye: No discharge.     Extraocular Movements: Extraocular  movements intact.     Conjunctiva/sclera: Conjunctivae normal.     Pupils: Pupils are equal, round, and reactive to light.  Neck:     Thyroid: No thyroid mass, thyromegaly or thyroid tenderness.     Vascular: No carotid bruit.  Cardiovascular:     Rate and Rhythm: Normal rate and  regular rhythm.     Pulses: Normal pulses.     Heart sounds: Normal heart sounds. No murmur heard.    No friction rub. No gallop.  Pulmonary:     Effort: Pulmonary effort is normal.     Breath sounds: Normal breath sounds. No wheezing, rhonchi or rales.  Chest:     Chest wall: No tenderness.  Abdominal:     General: Bowel sounds are normal. There is no distension.     Palpations: Abdomen is soft.     Tenderness: There is no abdominal tenderness. There is no guarding.  Musculoskeletal:        General: No swelling, deformity or signs of injury.     Cervical back: Neck supple.     Right lower leg: No edema.     Left lower leg: No edema.  Lymphadenopathy:     Cervical: No cervical adenopathy.     Right cervical: No superficial or posterior cervical adenopathy.    Left cervical: No superficial cervical adenopathy.  Skin:    Coloration: Skin is not jaundiced.     Findings: No rash.  Neurological:     General: No focal deficit present.     Mental Status: She is alert and oriented to person, place, and time.     Motor: No weakness.     Deep Tendon Reflexes: Reflexes normal.  Psychiatric:        Behavior: Behavior normal.      No results found for any visits on 04/03/24. Last CBC Lab Results  Component Value Date   WBC 9.5 08/09/2023   HGB 13.8 08/09/2023   HCT 41.6 08/09/2023   MCV 88.9 08/09/2023   MCH 29.5 08/09/2023   RDW 12.1 08/09/2023   PLT 344 08/09/2023   Last metabolic panel Lab Results  Component Value Date   GLUCOSE 60 (L) 08/09/2023   NA 141 08/09/2023   K 3.6 08/09/2023   CL 104 08/09/2023   CO2 22 08/09/2023   BUN 7 08/09/2023   CREATININE 0.56 08/09/2023   GFRNONAA >60  08/09/2023   CALCIUM 9.6 08/09/2023   PROT 6.2 (L) 12/07/2019   ALBUMIN 3.1 (L) 12/07/2019   BILITOT 0.6 12/07/2019   ALKPHOS 124 12/07/2019   AST 22 12/07/2019   ALT 11 12/07/2019   ANIONGAP 15 08/09/2023   Last lipids No results found for: CHOL, HDL, LDLCALC, LDLDIRECT, TRIG, CHOLHDL Last hemoglobin A1c No results found for: HGBA1C      Assessment & Plan:    Routine Health Maintenance and Physical Exam  Health Maintenance  Topic Date Due   Hepatitis B Vaccine (1 of 3 - 19+ 3-dose series) Never done   HPV Vaccine (1 - 3-dose SCDM series) Never done   COVID-19 Vaccine (1 - 2025-26 season) 04/19/2024*   Flu Shot  09/03/2024*   Pneumococcal Vaccine (1 of 2 - PCV) 04/03/2025*   Pap Smear  12/12/2026   DTaP/Tdap/Td vaccine (2 - Td or Tdap) 09/15/2031   Hepatitis C Screening  Completed   HIV Screening  Completed   Meningitis B Vaccine  Aged Out  *Topic was postponed. The date shown is not the original due date.   Assessment and Plan    Annual Exam Mostly UTD on vaccines, any that she needs we will defer to [facility] for now. Diet going fine- no nausea- weight gain appropriate. No exercise- encourages mild exercise. Things appear to be going very well aside from the smoking- strongly encouraged to quit for overall fetal  health and discussed techniques.   Pregnancy [redacted] weeks gestation, with tobacco use/ Encounter for Tobacco Cessation She is [redacted] weeks pregnant with a due date of March 28th. She smokes approximately three cigarettes daily and is working on cessation. Risks of continued smoking include increased risk for fetal asthma and growth restriction. She has successfully quit smoking during a previous pregnancy and is confident in her ability to do so again. Various cessation methods have been attempted, including gum and snacking. The importance of setting a quit date and the benefits of quitting, ideally before the third trimester, were discussed. Quitting by week 15  offers the most benefits, but cessation at any time is advantageous. - Set a quit date for smoking cessation on April 17, 2024. - Reduce cigarette consumption to two per day starting today, then to one per day next week, and cease completely by the quit date. - Consider using lozenges (non-nicotine) as an alternative to smoking when the urge arises. - Encourage continued prenatal care with OB GYN. - Advise to stay active and continue walking for a healthier pregnancy.        Return in about 1 year (around 04/03/2025) for Annual Physical.     Rowena Moilanen T Ramsha Lonigro, PA-C

## 2024-04-03 NOTE — Patient Instructions (Addendum)
 Things to do to keep yourself healthy! - Exercise at least 30-45 minutes a day, 3-4 days a week.  - Eat a low-fat diet with lots of fruits and vegetables, up to 7-9 servings per day.  - Seatbelts can save your life. Wear them always.  - Smoke detectors on every level of your home, check batteries every year.  - Eye Doctor: have an eye exam every 1-2 years, even if you do not wear glasses or contacts. - Safe sex: if you may be exposed to STDs, use a condom.  - Alcohol: If you drink, do it moderately, less than 2 drinks per day.  - Health Care Power of Attorney: choose someone to speak for you if you are not able.  - Depression and anxiety are common in our stressful world.If you're feeling stressed, down, or like you're losing interest in things you normally enjoy, please come in for a visit.  - Violence: If anyone is threatening or hurting you, please call immediately.  Everyone deserves to be safe and loved in all of the relationships.   Your Quit Date for Smoking is: April 17, 2024

## 2024-06-12 ENCOUNTER — Inpatient Hospital Stay (HOSPITAL_COMMUNITY)

## 2024-06-12 ENCOUNTER — Inpatient Hospital Stay (HOSPITAL_COMMUNITY)
Admission: AD | Admit: 2024-06-12 | Discharge: 2024-06-12 | Disposition: A | Discharge status: Home / Self Care | Attending: Student | Admitting: Student

## 2024-06-12 ENCOUNTER — Encounter (HOSPITAL_COMMUNITY): Payer: Self-pay

## 2024-06-12 ENCOUNTER — Other Ambulatory Visit: Payer: Self-pay

## 2024-06-12 DIAGNOSIS — S3991XA Unspecified injury of abdomen, initial encounter: Secondary | ICD-10-CM | POA: Diagnosis not present

## 2024-06-12 DIAGNOSIS — O99343 Other mental disorders complicating pregnancy, third trimester: Secondary | ICD-10-CM | POA: Diagnosis not present

## 2024-06-12 DIAGNOSIS — O9A213 Injury, poisoning and certain other consequences of external causes complicating pregnancy, third trimester: Secondary | ICD-10-CM | POA: Diagnosis not present

## 2024-06-12 DIAGNOSIS — O34219 Maternal care for unspecified type scar from previous cesarean delivery: Secondary | ICD-10-CM | POA: Insufficient documentation

## 2024-06-12 DIAGNOSIS — Z3689 Encounter for other specified antenatal screening: Secondary | ICD-10-CM

## 2024-06-12 DIAGNOSIS — Y93K1 Activity, walking an animal: Secondary | ICD-10-CM | POA: Insufficient documentation

## 2024-06-12 DIAGNOSIS — W19XXXA Unspecified fall, initial encounter: Secondary | ICD-10-CM

## 2024-06-12 DIAGNOSIS — W0110XA Fall on same level from slipping, tripping and stumbling with subsequent striking against unspecified object, initial encounter: Secondary | ICD-10-CM | POA: Diagnosis not present

## 2024-06-12 DIAGNOSIS — O09293 Supervision of pregnancy with other poor reproductive or obstetric history, third trimester: Secondary | ICD-10-CM | POA: Insufficient documentation

## 2024-06-12 DIAGNOSIS — O321XX0 Maternal care for breech presentation, not applicable or unspecified: Secondary | ICD-10-CM | POA: Diagnosis present

## 2024-06-12 DIAGNOSIS — Z3A28 28 weeks gestation of pregnancy: Secondary | ICD-10-CM

## 2024-06-12 DIAGNOSIS — O99333 Smoking (tobacco) complicating pregnancy, third trimester: Secondary | ICD-10-CM | POA: Diagnosis not present

## 2024-06-12 DIAGNOSIS — E669 Obesity, unspecified: Secondary | ICD-10-CM | POA: Diagnosis not present

## 2024-06-12 DIAGNOSIS — F17210 Nicotine dependence, cigarettes, uncomplicated: Secondary | ICD-10-CM | POA: Insufficient documentation

## 2024-06-12 DIAGNOSIS — S99919A Unspecified injury of unspecified ankle, initial encounter: Secondary | ICD-10-CM | POA: Insufficient documentation

## 2024-06-12 DIAGNOSIS — O99213 Obesity complicating pregnancy, third trimester: Secondary | ICD-10-CM | POA: Diagnosis present

## 2024-06-12 DIAGNOSIS — F419 Anxiety disorder, unspecified: Secondary | ICD-10-CM | POA: Diagnosis not present

## 2024-06-12 LAB — CBC
HCT: 32.5 % — ABNORMAL LOW (ref 36.0–46.0)
Hemoglobin: 11.2 g/dL — ABNORMAL LOW (ref 12.0–15.0)
MCH: 30.6 pg (ref 26.0–34.0)
MCHC: 34.5 g/dL (ref 30.0–36.0)
MCV: 88.8 fL (ref 80.0–100.0)
Platelets: 172 K/uL (ref 150–400)
RBC: 3.66 MIL/uL — ABNORMAL LOW (ref 3.87–5.11)
RDW: 12.5 % (ref 11.5–15.5)
WBC: 11.1 K/uL — ABNORMAL HIGH (ref 4.0–10.5)
nRBC: 0 % (ref 0.0–0.2)

## 2024-06-12 LAB — KLEIHAUER-BETKE STAIN
Fetal Cells %: 0 %
Quantitation Fetal Hemoglobin: 0 mL

## 2024-06-12 NOTE — [facility] Note (Signed)
 Alexis Hunt is a 29 y.o. at [redacted]w[redacted]d here in [facility] reporting: ankle got twisted in a hole in yard and fell on whole front side of body. Has not felt movements this morning. Pain in pelvic bones. Denies LOF, VB, regular contractions, blurry vision, headaches, peripheral edema, or RUQ pain.  Onset of complaint: 0745 Pain score: 1/10 Vitals:   06/12/24 0930  BP: 120/77  Pulse: 89  Resp: 16  Temp: 98.5 F (36.9 C)  SpO2: 100%     FHT: 150  Lab orders placed from triage: na

## 2024-06-12 NOTE — MAU Provider Note (Signed)
 Chief Complaint:  Fall   HPI  Fall at 0800     Alexis Hunt is a 29 y.o. G3P2002 at [redacted]w[redacted]d who presents to maternity admissions reporting that she was walking her dog this morning at approximately 0745 AM.  Unfortunately while walking the dog the patient states she lost her balance and stepped in a hole twisted her ankle and then fell directly on her abdomen as well as hitting her right shoulder.  Patient also reports that prior she had an incident where she was again walking her dog and the leash got tangled on her finger and she is currently wearing a right wrist splint with a clean break of her right ring finger.  The dog that she has is a Rottweiler approximately 21-year-old.  Patient states since the fall occurred she denies any vaginal bleeding, leaking of fluid and is reporting good fetal movement.   Pregnancy Course: [facility]  Past Medical History:  Diagnosis Date   Anxiety    Cesarean delivery delivered 12/10/2019   Cesarean delivery delivered 12/10/2019   Concussion    Family history of adverse reaction to anesthesia    dad possible seizures   History of gestational hypertension    Pregnancy induced hypertension    OB History  Gravida Para Term Preterm AB Living  3 2 2  0 0 2  SAB IAB Ectopic Multiple Live Births  0 0 0 0 2    # Outcome Date GA Lbr Len/2nd Weight Sex Type Anes PTL Lv  3 Current           2 Term 09/13/21 [redacted]w[redacted]d  3480 g M CS-LTranv Spinal  LIV  1 Term 12/08/19 [redacted]w[redacted]d  3695 g F CS-LTranv EPI  LIV   Past Surgical History:  Procedure Laterality Date   CESAREAN SECTION N/A 12/08/2019   Procedure: CESAREAN SECTION;  Surgeon: Horacio Boas, MD;  Location: [facility];  Service: Obstetrics;  Laterality: N/A;   CESAREAN SECTION N/A 09/13/2021   Procedure: CESAREAN SECTION;  Surgeon: Horacio Boas, MD;  Location: [facility];  Service: Obstetrics;  Laterality: N/A;   COLPOSCOPY  06/2022   Family History  Problem Relation Age of Onset   Hypertension Mother     Brain cancer Mother        tumor   Atrial fibrillation Mother    Depression Mother    Hypercholesterolemia Mother    Seizures Father    Healthy Brother    Heart disease Maternal Grandfather    Hyperlipidemia Maternal Grandfather    Stroke Maternal Grandfather    Hyperlipidemia Paternal Grandmother    Hypertension Paternal Grandmother    Hyperlipidemia Paternal Grandfather    Social History[1] Allergies[2] No medications prior to admission.    I have reviewed patient's Past Medical Hx, Surgical Hx, Family Hx, Social Hx, medications and allergies.   ROS  Pertinent items noted in HPI and remainder of comprehensive ROS otherwise negative.   PHYSICAL EXAM  Patient Vitals for the past 24 hrs:  BP Temp Temp src Pulse Resp SpO2 Height Weight  06/12/24 1429 112/67 98.4 F (36.9 C) Oral 80 18 99 % -- --  06/12/24 0930 120/77 98.5 F (36.9 C) Oral 89 16 100 % 5' 1 (1.549 m) 74.5 kg    Constitutional: Well-developed, well-nourished female in no acute distress.  Cardiovascular: normal rate & rhythm, warm and well-perfused Respiratory: normal effort, no problems with respiration noted GI: Abd soft, non-tender, gravid MS: Extremities nontender, no edema, normal ROM Neurologic:  Alert and oriented x 4.       Fetal Tracing: Reactive for GA ( @ 1428) NST reviewed  Baseline: 140 Variability:moderate Accelerations: 10x10 Decelerations: absent Toco: quite   Labs: Results for orders placed or performed during the hospital encounter of 06/12/24 (from the past 24 hours)  CBC     Status: Abnormal   Collection Time: 06/12/24 10:25 AM  Result Value Ref Range   WBC 11.1 (H) 4.0 - 10.5 K/uL   RBC 3.66 (L) 3.87 - 5.11 MIL/uL   Hemoglobin 11.2 (L) 12.0 - 15.0 g/dL   HCT 67.4 (L) 63.9 - 53.9 %   MCV 88.8 80.0 - 100.0 fL   MCH 30.6 26.0 - 34.0 pg   MCHC 34.5 30.0 - 36.0 g/dL   RDW 87.4 88.4 - 84.4 %   Platelets 172 150 - 400 K/uL   nRBC 0.0 0.0 - 0.2 %  Kleihauer-Betke stain      Status: None   Collection Time: 06/12/24 10:25 AM  Result Value Ref Range   Fetal Cells % 0 %   Quantitation Fetal Hemoglobin 0.0000 mL   # Vials RhIg NOT INDICATED     Imaging:  US  MFM OB LIMITED Result Date: 06/12/2024 ----------------------------------------------------------------------  OBSTETRICS REPORT                       (Signed Final 06/12/2024 05:00 pm) ---------------------------------------------------------------------- Patient Info  ID #:       [id number]                          D.O.B.:  1996-05-19 (28 yrs)(F)  Name:       Alexis Hunt                 Visit Date: 06/12/2024 01:35 pm ---------------------------------------------------------------------- Performed By  Attending:        Steffan Keys MD         Referred By:      [facility] MAU/Triage  Performed By:     Powell Breen BS       [facility] ---------------------------------------------------------------------- Orders  #  Description                           Code        Ordered By  1  US  MFM OB LIMITED                     23184.98    OLAM DALTON ----------------------------------------------------------------------  #  Order #                     Accession #                Episode #  1  [id number]                   [id number]                 [id number] ---------------------------------------------------------------------- Indications  Traumatic injury during  pregnancy (fall today) O9A.219 T14.90  Obesity complicating pregnancy, third          O99.213  trimester  Previous cesarean delivery, antepartum x 2     O34.219  [redacted] weeks gestation of pregnancy                Z3A.28 ---------------------------------------------------------------------- Fetal Evaluation  Num Of Fetuses:         1  Fetal Heart Rate(bpm):  148  Cardiac Activity:       Observed  Presentation:           Breech  Placenta:               Anterior  P. Cord Insertion:      Visualized,  central  Amniotic Fluid  AFI FV:      Within normal limits  AFI Sum(cm)     %Tile       Largest Pocket(cm)  13.2            39          5  RUQ(cm)       RLQ(cm)       LUQ(cm)        LLQ(cm)  3.9           2.1           5              2.2  Comment:    No placental abruption or previa identified. ---------------------------------------------------------------------- OB History  Gravidity:    3         Term:   2        Prem:   0        SAB:   0  TOP:          0       Ectopic:  0        Living: 2 ---------------------------------------------------------------------- Gestational Age  LMP:           28w 4d        Date:  11/25/23                  EDD:   08/31/24  Best:          [place] 4d     Det. By:  LMP  (11/25/23)          EDD:   08/31/24 ---------------------------------------------------------------------- Anatomy  Cranium:               Appears normal         Stomach:                Appears normal, left                                                                        sided  Ventricles:            Appears normal         Kidneys:                Appear normal  Thoracic:              Appears normal         Bladder:  Appears normal  Diaphragm:             Appears normal ---------------------------------------------------------------------- Cervix Uterus Adnexa  Cervix  Not visualized (advanced GA >24wks)  Uterus  No abnormality visualized.  Right Ovary  Not visualized.  Left Ovary  Not visualized.  Cul De Sac  No free fluid seen.  Adnexa  No abnormality visualized ---------------------------------------------------------------------- Comments  This patient presented to the MAU following a fall.  She  twisted her ankle and fell on her abdomen and hit her right  shoulder while walking her dog.  A limited ultrasound performed today shows that the fetus is  in the breech presentation.  There was normal amniotic fluid noted with a total AFI of 13.2  cm.  A normal appearing anterior placenta was noted.  ----------------------------------------------------------------------                  Steffan Keys, MD Electronically Signed Final Report   06/12/2024 05:00 pm ----------------------------------------------------------------------    MDM & MAU COURSE  MDM:  HIGH  PN Chart reviewed Physical exam performed Prolonged fetal monitoring x 4 hours status post abdominal trauma CBC with KB ordered due to direct  abdominal trauma  Limited MFM OB sono After review of NST and ultrasound images fetal testing is reassuring Patient was given strict precautions regarding abdominal trauma and pregnancy and was advised to return if signs or symptoms of vaginal bleeding leaking fluid decreased fetal movements occur Follow-up as scheduled with OB provider    MAU Course: Orders Placed This Encounter  Procedures   US  MFM OB LIMITED   CBC   Kleihauer-Betke stain   Discharge patient Discharge disposition: 01-Home or Self Care; Discharge patient date: 06/12/2024    ASSESSMENT   1. Traumatic injury during pregnancy in third trimester   2. [redacted] weeks gestation of pregnancy   3. NST (non-stress test) reactive on fetal surveillance   4. Fall, initial encounter     PLAN  Discharge home in stable condition with return precautions.   Strict return precautions Fetal kick Counts OB F/U as scheduled   See AVS for full description of information given to the patient including both verbal and written. Patient verbalized understanding and agrees with the plan as described above.     Follow-up Information     [facility] Follow up.   Why: If symptoms worsen or fail to resolve, As scheduled for ongoing prenatal care Contact information: [address]  [address] [place] [address] [phone]                 Allergies as of 06/12/2024   No Known Allergies      Medication List     TAKE these medications    acetaminophen  500 MG tablet Commonly known as: TYLENOL  Take  1,000 mg by mouth as needed.   [employer] Use with the albuterol  inhaler   EPINEPHrine  0.3 mg/0.3 mL Soaj injection Commonly known as: EpiPen  2-Pak Inject 0.3 mg into the muscle as needed for anaphylaxis.   escitalopram  20 MG tablet Commonly known as: LEXAPRO  TAKE 1 TABLET BY MOUTH EVERY DAY   hydrOXYzine  25 MG tablet Commonly known as: ATARAX  Take 1 tablet (25 mg total) by mouth every 6 (six) hours as needed for anxiety.   PRENATAL AD PO 2 GUMMIES DAILY        Olam Dalton, MSN, [facility], [facility] [employer]       [1]  Social History Tobacco Use   Smoking status: Every Day    Current packs/day: 0.25    Average packs/day: 0.3 packs/day for 10.0 years (2.5 ttl pk-yrs)    Types: Cigarettes    Start date: 2016    Passive exposure: Current   Smokeless tobacco: Never   Tobacco comments:    QUIT SMOKING AND RESTARTED  Vaping Use   Vaping status: Never Used  Substance Use Topics   Alcohol use: Not Currently    Comment: occasionally   Drug use: No  [2] No Known Allergies

## 2024-08-26 ENCOUNTER — Inpatient Hospital Stay (HOSPITAL_COMMUNITY): Admit: 2024-08-26 | Admitting: Student

## 2024-08-26 ENCOUNTER — Encounter (HOSPITAL_COMMUNITY): Payer: Self-pay

## 2024-08-26 DIAGNOSIS — O34219 Maternal care for unspecified type scar from previous cesarean delivery: Secondary | ICD-10-CM

## 2024-08-26 SURGERY — Surgical Case
Anesthesia: Spinal

## 2025-04-07 ENCOUNTER — Encounter (HOSPITAL_BASED_OUTPATIENT_CLINIC_OR_DEPARTMENT_OTHER): Admitting: Student
